# Patient Record
Sex: Female | Born: 1995 | Race: Black or African American | Hispanic: No | Marital: Single | Smoking: Never smoker
Health system: Southern US, Community
[De-identification: ages and names within clinical notes are randomized; demographics above are authoritative.]

## PROBLEM LIST (undated history)

## (undated) DIAGNOSIS — N83209 Unspecified ovarian cyst, unspecified side: Secondary | ICD-10-CM

## (undated) DIAGNOSIS — R519 Headache, unspecified: Secondary | ICD-10-CM

## (undated) DIAGNOSIS — R51 Headache: Secondary | ICD-10-CM

## (undated) HISTORY — DX: Unspecified ovarian cyst, unspecified side: N83.209

## (undated) HISTORY — PX: NO PAST SURGERIES: SHX2092

---
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
## 2016-04-29 ENCOUNTER — Ambulatory Visit (INDEPENDENT_AMBULATORY_CARE_PROVIDER_SITE_OTHER): Payer: Commercial Managed Care - PPO | Admitting: Family Medicine

## 2016-04-29 ENCOUNTER — Encounter: Payer: Self-pay | Admitting: Family Medicine

## 2016-04-29 VITALS — BP 110/77 | Temp 98.8°F | Ht 61.0 in | Wt 191.0 lb

## 2016-04-29 DIAGNOSIS — Z23 Encounter for immunization: Secondary | ICD-10-CM

## 2016-04-29 DIAGNOSIS — Z0000 Encounter for general adult medical examination without abnormal findings: Secondary | ICD-10-CM

## 2016-04-29 LAB — UA/M W/RFLX CULTURE, ROUTINE
Bilirubin, UA: NEGATIVE
GLUCOSE, UA: NEGATIVE
KETONES UA: NEGATIVE
LEUKOCYTES UA: NEGATIVE
NITRITE UA: NEGATIVE
Protein, UA: NEGATIVE
RBC UA: NEGATIVE
SPEC GRAV UA: 1.015 (ref 1.005–1.030)
Urobilinogen, Ur: 0.2 mg/dL (ref 0.2–1.0)
pH, UA: 7 (ref 5.0–7.5)

## 2016-04-29 NOTE — Patient Instructions (Addendum)
Health Maintenance, Female Adopting a healthy lifestyle and getting preventive care can go a long way to promote health and wellness. Talk with your health care provider about what schedule of regular examinations is right for you. This is a good chance for you to check in with your provider about disease prevention and staying healthy. In between checkups, there are plenty of things you can do on your own. Experts have done a lot of research about which lifestyle changes and preventive measures are most likely to keep you healthy. Ask your health care provider for more information. WEIGHT AND DIET  Eat a healthy diet  Be sure to include plenty of vegetables, fruits, low-fat dairy products, and lean protein.  Do not eat a lot of foods high in solid fats, added sugars, or salt.  Get regular exercise. This is one of the most important things you can do for your health.  Most adults should exercise for at least 150 minutes each week. The exercise should increase your heart rate and make you sweat (moderate-intensity exercise).  Most adults should also do strengthening exercises at least twice a week. This is in addition to the moderate-intensity exercise.  Maintain a healthy weight  Body mass index (BMI) is a measurement that can be used to identify possible weight problems. It estimates body fat based on height and weight. Your health care provider can help determine your BMI and help you achieve or maintain a healthy weight.  For females 20 years of age and older:   A BMI below 18.5 is considered underweight.  A BMI of 18.5 to 24.9 is normal.  A BMI of 25 to 29.9 is considered overweight.  A BMI of 30 and above is considered obese.  Watch levels of cholesterol and blood lipids  You should start having your blood tested for lipids and cholesterol at 20 years of age, then have this test every 5 years.  You may need to have your cholesterol levels checked more often if:  Your lipid  or cholesterol levels are high.  You are older than 19 years of age.  You are at high risk for heart disease.  CANCER SCREENING   Lung Cancer  Lung cancer screening is recommended for adults 55-80 years old who are at high risk for lung cancer because of a history of smoking.  A yearly low-dose CT scan of the lungs is recommended for people who:  Currently smoke.  Have quit within the past 15 years.  Have at least a 30-pack-year history of smoking. A pack year is smoking an average of one pack of cigarettes a day for 1 year.  Yearly screening should continue until it has been 15 years since you quit.  Yearly screening should stop if you develop a health problem that would prevent you from having lung cancer treatment.  Breast Cancer  Practice breast self-awareness. This means understanding how your breasts normally appear and feel.  It also means doing regular breast self-exams. Let your health care provider know about any changes, no matter how small.  If you are in your 20s or 30s, you should have a clinical breast exam (CBE) by a health care provider every 1-3 years as part of a regular health exam.  If you are 40 or older, have a CBE every year. Also consider having a breast X-ray (mammogram) every year.  If you have a family history of breast cancer, talk to your health care provider about genetic screening.  If you   are at high risk for breast cancer, talk to your health care provider about having an MRI and a mammogram every year.  Breast cancer gene (BRCA) assessment is recommended for women who have family members with BRCA-related cancers. BRCA-related cancers include:  Breast.  Ovarian.  Tubal.  Peritoneal cancers.  Results of the assessment will determine the need for genetic counseling and BRCA1 and BRCA2 testing. Cervical Cancer Your health care provider may recommend that you be screened regularly for cancer of the pelvic organs (ovaries, uterus, and  vagina). This screening involves a pelvic examination, including checking for microscopic changes to the surface of your cervix (Pap test). You may be encouraged to have this screening done every 3 years, beginning at age 21.  For women ages 30-65, health care providers may recommend pelvic exams and Pap testing every 3 years, or they may recommend the Pap and pelvic exam, combined with testing for human papilloma virus (HPV), every 5 years. Some types of HPV increase your risk of cervical cancer. Testing for HPV may also be done on women of any age with unclear Pap test results.  Other health care providers may not recommend any screening for nonpregnant women who are considered low risk for pelvic cancer and who do not have symptoms. Ask your health care provider if a screening pelvic exam is right for you.  If you have had past treatment for cervical cancer or a condition that could lead to cancer, you need Pap tests and screening for cancer for at least 20 years after your treatment. If Pap tests have been discontinued, your risk factors (such as having a new sexual partner) need to be reassessed to determine if screening should resume. Some women have medical problems that increase the chance of getting cervical cancer. In these cases, your health care provider may recommend more frequent screening and Pap tests. Colorectal Cancer  This type of cancer can be detected and often prevented.  Routine colorectal cancer screening usually begins at 19 years of age and continues through 19 years of age.  Your health care provider may recommend screening at an earlier age if you have risk factors for colon cancer.  Your health care provider may also recommend using home test kits to check for hidden blood in the stool.  A small camera at the end of a tube can be used to examine your colon directly (sigmoidoscopy or colonoscopy). This is done to check for the earliest forms of colorectal  cancer.  Routine screening usually begins at age 50.  Direct examination of the colon should be repeated every 5-10 years through 19 years of age. However, you may need to be screened more often if early forms of precancerous polyps or small growths are found. Skin Cancer  Check your skin from head to toe regularly.  Tell your health care provider about any new moles or changes in moles, especially if there is a change in a mole's shape or color.  Also tell your health care provider if you have a mole that is larger than the size of a pencil eraser.  Always use sunscreen. Apply sunscreen liberally and repeatedly throughout the day.  Protect yourself by wearing long sleeves, pants, a wide-brimmed hat, and sunglasses whenever you are outside. HEART DISEASE, DIABETES, AND HIGH BLOOD PRESSURE   High blood pressure causes heart disease and increases the risk of stroke. High blood pressure is more likely to develop in:  People who have blood pressure in the high end   of the normal range (130-139/85-89 mm Hg).  People who are overweight or obese.  People who are African American.  If you are 38-23 years of age, have your blood pressure checked every 3-5 years. If you are 61 years of age or older, have your blood pressure checked every year. You should have your blood pressure measured twice--once when you are at a hospital or clinic, and once when you are not at a hospital or clinic. Record the average of the two measurements. To check your blood pressure when you are not at a hospital or clinic, you can use:  An automated blood pressure machine at a pharmacy.  A home blood pressure monitor.  If you are between 45 years and 39 years old, ask your health care provider if you should take aspirin to prevent strokes.  Have regular diabetes screenings. This involves taking a blood sample to check your fasting blood sugar level.  If you are at a normal weight and have a low risk for diabetes,  have this test once every three years after 19 years of age.  If you are overweight and have a high risk for diabetes, consider being tested at a younger age or more often. PREVENTING INFECTION  Hepatitis B  If you have a higher risk for hepatitis B, you should be screened for this virus. You are considered at high risk for hepatitis B if:  You were born in a country where hepatitis B is common. Ask your health care provider which countries are considered high risk.  Your parents were born in a high-risk country, and you have not been immunized against hepatitis B (hepatitis B vaccine).  You have HIV or AIDS.  You use needles to inject street drugs.  You live with someone who has hepatitis B.  You have had sex with someone who has hepatitis B.  You get hemodialysis treatment.  You take certain medicines for conditions, including cancer, organ transplantation, and autoimmune conditions. Hepatitis C  Blood testing is recommended for:  Everyone born from [birth year] through 1964.  Anyone with known risk factors for hepatitis C. Sexually transmitted infections (STIs)  You should be screened for sexually transmitted infections (STIs) including gonorrhea and chlamydia if:  You are sexually active and are younger than 19 years of age.  You are older than 19 years of age and your health care provider tells you that you are at risk for this type of infection.  Your sexual activity has changed since you were last screened and you are at an increased risk for chlamydia or gonorrhea. Ask your health care provider if you are at risk.  If you do not have HIV, but are at risk, it may be recommended that you take a prescription medicine daily to prevent HIV infection. This is called pre-exposure prophylaxis (PrEP). You are considered at risk if:  You are sexually active and do not regularly use condoms or know the HIV status of your partner(s).  You take drugs by injection.  You are sexually  active with a partner who has HIV. Talk with your health care provider about whether you are at high risk of being infected with HIV. If you choose to begin PrEP, you should first be tested for HIV. You should then be tested every 3 months for as long as you are taking PrEP.  PREGNANCY   If you are premenopausal and you may become pregnant, ask your health care provider about preconception counseling.  If you may  become pregnant, take 400 to 800 micrograms (mcg) of folic acid every day.  If you want to prevent pregnancy, talk to your health care provider about birth control (contraception). OSTEOPOROSIS AND MENOPAUSE   Osteoporosis is a disease in which the bones lose minerals and strength with aging. This can result in serious bone fractures. Your risk for osteoporosis can be identified using a bone density scan.  If you are 63 years of age or older, or if you are at risk for osteoporosis and fractures, ask your health care provider if you should be screened.  Ask your health care provider whether you should take a calcium or vitamin D supplement to lower your risk for osteoporosis.  Menopause may have certain physical symptoms and risks.  Hormone replacement therapy may reduce some of these symptoms and risks. Talk to your health care provider about whether hormone replacement therapy is right for you.  HOME CARE INSTRUCTIONS   Schedule regular health, dental, and eye exams.  Stay current with your immunizations.   Do not use any tobacco products including cigarettes, chewing tobacco, or electronic cigarettes.  If you are pregnant, do not drink alcohol.  If you are breastfeeding, limit how much and how often you drink alcohol.  Limit alcohol intake to no more than 1 drink per day for nonpregnant women. One drink equals 12 ounces of beer, 5 ounces of wine, or 1 ounces of hard liquor.  Do not use street drugs.  Do not share needles.  Ask your health care provider for help if  you need support or information about quitting drugs.  Tell your health care provider if you often feel depressed.  Tell your health care provider if you have ever been abused or do not feel safe at home.   This information is not intended to replace advice given to you by your health care provider. Make sure you discuss any questions you have with your health care provider.   Document Released: 03/03/2011 Document Revised: 09/08/2014 Document Reviewed: 07/20/2013 Elsevier Interactive Patient Education 2016 [employer] Influenza (Flu) Vaccine (Inactivated or Recombinant):  1. Why get vaccinated? Influenza ("flu") is a contagious disease that spreads around the [place] every year, usually between October and May. Flu is caused by influenza viruses, and is spread mainly by coughing, sneezing, and close contact. Anyone can get flu. Flu strikes suddenly and can last several days. Symptoms vary by age, but can include:  fever/chills  sore throat  muscle aches  fatigue  cough  headache  runny or stuffy nose Flu can also lead to pneumonia and blood infections, and cause diarrhea and seizures in children. If you have a medical condition, such as heart or lung disease, flu can make it worse. Flu is more dangerous for some people. Infants and young children, people 57 years of age and older, pregnant women, and people with certain health conditions or a weakened immune system are at greatest risk. Each year thousands of people in the [place] die from flu, and many more are hospitalized. Flu vaccine can:  keep you from getting flu,  make flu less severe if you do get it, and  keep you from spreading flu to your family and other people. 2. Inactivated and recombinant flu vaccines A dose of flu vaccine is recommended every flu season. Children 6 months through 55 years of age may need two doses during the same flu season. Everyone else needs only one dose each flu  season. Some inactivated flu  vaccines contain a very small amount of a mercury-based preservative called thimerosal. Studies have not shown thimerosal in vaccines to be harmful, but flu vaccines that do not contain thimerosal are available. There is no live flu virus in flu shots. They cannot cause the flu. There are many flu viruses, and they are always changing. Each year a new flu vaccine is made to protect against three or four viruses that are likely to cause disease in the upcoming flu season. But even when the vaccine doesn't exactly match these viruses, it may still provide some protection. Flu vaccine cannot prevent:  flu that is caused by a virus not covered by the vaccine, or  illnesses that look like flu but are not. It takes about 2 weeks for protection to develop after vaccination, and protection lasts through the flu season. 3. Some people should not get this vaccine Tell the person who is giving you the vaccine:  If you have any severe, life-threatening allergies. If you ever had a life-threatening allergic reaction after a dose of flu vaccine, or have a severe allergy to any part of this vaccine, you may be advised not to get vaccinated. Most, but not all, types of flu vaccine contain a small amount of egg protein.  If you ever had Guillain-Barre Syndrome (also called GBS). Some people with a history of GBS should not get this vaccine. This should be discussed with your doctor.  If you are not feeling well. It is usually okay to get flu vaccine when you have a mild illness, but you might be asked to come back when you feel better. 4. Risks of a vaccine reaction With any medicine, including vaccines, there is a chance of reactions. These are usually mild and go away on their own, but serious reactions are also possible. Most people who get a flu shot do not have any problems with it. Minor problems following a flu shot include:  soreness, redness, or swelling where the shot was  given  hoarseness  sore, red or itchy eyes  cough  fever  aches  headache  itching  fatigue If these problems occur, they usually begin soon after the shot and last 1 or 2 days. More serious problems following a flu shot can include the following:  There may be a small increased risk of Guillain-Barre Syndrome (GBS) after inactivated flu vaccine. This risk has been estimated at 1 or 2 additional cases per million people vaccinated. This is much lower than the risk of severe complications from flu, which can be prevented by flu vaccine.  Young children who get the flu shot along with pneumococcal vaccine (PCV13) and/or DTaP vaccine at the same time might be slightly more likely to have a seizure caused by fever. Ask your doctor for more information. Tell your doctor if a child who is getting flu vaccine has ever had a seizure. Problems that could happen after any injected vaccine:  People sometimes faint after a medical procedure, including vaccination. Sitting or lying down for about 15 minutes can help prevent fainting, and injuries caused by a fall. Tell your doctor if you feel dizzy, or have vision changes or ringing in the ears.  Some people get severe pain in the shoulder and have difficulty moving the arm where a shot was given. This happens very rarely.  Any medication can cause a severe allergic reaction. Such reactions from a vaccine are very rare, estimated at about 1 in a million doses, and would happen within  a few minutes to a few hours after the vaccination. As with any medicine, there is a very remote chance of a vaccine causing a serious injury or death. The safety of vaccines is always being monitored. For more information, visit: http://www.aguilar.org/ 5. What if there is a serious reaction? What should I look for?  Look for anything that concerns you, such as signs of a severe allergic reaction, very high fever, or unusual behavior. Signs of a severe  allergic reaction can include hives, swelling of the face and throat, difficulty breathing, a fast heartbeat, dizziness, and weakness. These would start a few minutes to a few hours after the vaccination. What should I do?  If you think it is a severe allergic reaction or other emergency that can't wait, call 9-1-1 and get the person to the nearest hospital. Otherwise, call your doctor.  Reactions should be reported to the Vaccine Adverse Event Reporting System (VAERS). Your doctor should file this report, or you can do it yourself through the VAERS web site at www.vaers.SamedayNews.es, or by calling (416)694-4114. VAERS does not give medical advice. 6. The National Vaccine Injury Compensation Program The [employer] Vaccine Injury Compensation Program (VICP) is a federal program that was created to compensate people who may have been injured by certain vaccines. Persons who believe they may have been injured by a vaccine can learn about the program and about filing a claim by calling (716)694-3407 or visiting the [place] website at GoldCloset.com.ee. There is a time limit to file a claim for compensation. 7. How can I learn more?  Ask your healthcare provider. He or she can give you the vaccine package insert or suggest other sources of information.  Call your local or state health department.  Contact the Centers for Disease Control and Prevention (CDC):  Call 5793384476 (1-800-CDC-INFO) or  Visit CDC's website at https://gibson.com/ Vaccine Information Statement Inactivated Influenza Vaccine (04/07/2014)   This information is not intended to replace advice given to you by your health care provider. Make sure you discuss any questions you have with your health care provider.   Document Released: 06/12/2006 Document Revised: 09/08/2014 Document Reviewed: 04/10/2014 Elsevier Interactive Patient Education [employer].

## 2016-04-29 NOTE — Progress Notes (Signed)
BP 110/77 (BP Location: Left Arm, Patient Position: Sitting, Cuff Size: Large)   Temp 98.8 F (37.1 C)   Ht 5\' 1"  (1.549 m)   Wt 191 lb (86.6 kg)   SpO2 99%   BMI 36.09 kg/m    Subjective:    Patient ID: Beth Wagner, female    DOB: Mar 15, 1996, 20 y.o.   MRN: [id number]  HPI: Beth Wagner is a 20 y.o. female presenting on 04/29/2016 for comprehensive medical examination. Current medical complaints include:none  She currently lives with: Mom and Dad Menopausal Symptoms: no  Depression Screen done today and results listed below:  Depression screen [facility] 2/9 04/29/2016  Decreased Interest 0  Down, Depressed, Hopeless 0  PHQ - 2 Score 0    Past Medical History:  Past Medical History:  Diagnosis Date  . Ovarian cyst     Surgical History:  History reviewed. No pertinent surgical history.  Medications:  No current outpatient prescriptions on file prior to visit.   No current facility-administered medications on file prior to visit.     Allergies:  No Known Allergies  Social History:  Social History   Social History  . Marital status: Single    Spouse name: N/A  . Number of children: N/A  . Years of education: N/A   Occupational History  . Not on file.   Social History Main Topics  . Smoking status: Never Smoker  . Smokeless tobacco: Never Used  . Alcohol use No  . Drug use: No  . Sexual activity: Not on file   Other Topics Concern  . Not on file   Social History Narrative  . No narrative on file   History  Smoking Status  . Never Smoker  Smokeless Tobacco  . Never Used   History  Alcohol Use No    Family History:  Family History  Problem Relation Age of Onset  . Cancer Father     kidney  . Kidney disease Father   . Hypertension Father   . Hypertension Maternal Aunt   . Hypertension Paternal Aunt   . Stroke Maternal Grandmother   . Stroke Maternal Grandfather     Past medical history, surgical history, medications, allergies, family  history and social history reviewed with patient today and changes made to appropriate areas of the chart.   Review of Systems  Constitutional: Negative.   HENT: Negative.   Eyes: Negative.   Respiratory: Negative.   Cardiovascular: Negative.   Gastrointestinal: Negative.   Genitourinary: Negative.   Musculoskeletal: Negative.   Skin: Negative.   Neurological: Negative.   Endo/Heme/Allergies: Negative.     All other ROS negative except what is listed above and in the HPI.      Objective:    BP 110/77 (BP Location: Left Arm, Patient Position: Sitting, Cuff Size: Large)   Temp 98.8 F (37.1 C)   Ht 5\' 1"  (1.549 m)   Wt 191 lb (86.6 kg)   SpO2 99%   BMI 36.09 kg/m   Wt Readings from Last 3 Encounters:  04/29/16 191 lb (86.6 kg) (96 %, Z= 1.81)*  12/01/15 180 lb (81.6 kg) (95 %, Z= 1.64)*  03/29/15 173 lb (78.5 kg) (94 %, Z= 1.54)*   * Growth percentiles are based on CDC 2-20 Years data.    Physical Exam  Constitutional: She is oriented to person, place, and time. She appears well-developed and well-nourished. No distress.  HENT:  Head: Normocephalic and atraumatic.  Right Ear: Hearing, tympanic membrane, external  ear and ear canal normal.  Left Ear: Hearing, tympanic membrane, external ear and ear canal normal.  Nose: Nose normal.  Mouth/Throat: Uvula is midline, oropharynx is clear and moist and mucous membranes are normal. No oropharyngeal exudate.  Eyes: Conjunctivae, EOM and lids are normal. Pupils are equal, round, and reactive to light. Right eye exhibits no discharge. Left eye exhibits no discharge. No scleral icterus.  Neck: Normal range of motion. Neck supple. No JVD present. No tracheal deviation present. No thyromegaly present.  Cardiovascular: Normal rate, regular rhythm, normal heart sounds and intact distal pulses.  Exam reveals no gallop and no friction rub.   No murmur heard. Pulmonary/Chest: Effort normal and breath sounds normal. No stridor. No  respiratory distress. She has no wheezes. She has no rales. She exhibits no tenderness. Right breast exhibits no inverted nipple, no mass, no nipple discharge, no skin change and no tenderness. Left breast exhibits no inverted nipple, no mass, no nipple discharge, no skin change and no tenderness. Breasts are symmetrical.  Abdominal: Soft. Bowel sounds are normal. She exhibits no distension and no mass. There is no tenderness. There is no rebound and no guarding.  Genitourinary:  Genitourinary Comments: Deferred with shared decision making.   Musculoskeletal: Normal range of motion. She exhibits no edema, tenderness or deformity.  Lymphadenopathy:    She has no cervical adenopathy.  Neurological: She is alert and oriented to person, place, and time. She has normal reflexes. She displays normal reflexes. No cranial nerve deficit. She exhibits normal muscle tone. Coordination normal.  Skin: Skin is warm, dry and intact. No rash noted. She is not diaphoretic. No erythema. No pallor.  Psychiatric: She has a normal mood and affect. Her speech is normal and behavior is normal. Judgment and thought content normal. Cognition and memory are normal.  Nursing note and vitals reviewed.   Results for orders placed or performed during the hospital encounter of 12/01/15  Comprehensive metabolic panel  Result Value Ref Range   Sodium 134 (L) 135 - 145 mmol/L   Potassium 3.8 3.5 - 5.1 mmol/L   Chloride 101 101 - 111 mmol/L   CO2 25 22 - 32 mmol/L   Glucose, Bld 88 65 - 99 mg/dL   BUN 13 6 - 20 mg/dL   Creatinine, Ser 1.610.83 0.44 - 1.00 mg/dL   Calcium 9.2 8.9 - 09.610.3 mg/dL   Total Protein 8.1 6.5 - 8.1 g/dL   Albumin 4.4 3.5 - 5.0 g/dL   AST 22 15 - 41 U/L   ALT 25 14 - 54 U/L   Alkaline Phosphatase 77 38 - 126 U/L   Total Bilirubin 1.1 0.3 - 1.2 mg/dL   GFR calc non Af Amer >60 >60 mL/min   GFR calc Af Amer >60 >60 mL/min   Anion gap 8 5 - 15  CBC  Result Value Ref Range   WBC 5.0 3.6 - 11.0 K/uL    RBC 5.30 (H) 3.80 - 5.20 MIL/uL   Hemoglobin 14.2 12.0 - 16.0 g/dL   HCT 04.542.6 40.935.0 - 81.147.0 %   MCV 80.3 80.0 - 100.0 fL   MCH 26.8 26.0 - 34.0 pg   MCHC 33.3 32.0 - 36.0 g/dL   RDW 91.414.2 78.211.5 - 95.614.5 %   Platelets 240 150 - 440 K/uL  Urinalysis complete, with microscopic ([facility] only)  Result Value Ref Range   Color, Urine YELLOW (A) YELLOW   APPearance CLEAR (A) CLEAR   Glucose, UA NEGATIVE NEGATIVE mg/dL  Bilirubin Urine NEGATIVE NEGATIVE   Ketones, ur NEGATIVE NEGATIVE mg/dL   Specific Gravity, Urine 1.012 1.005 - 1.030   Hgb urine dipstick NEGATIVE NEGATIVE   pH 6.0 5.0 - 8.0   Protein, ur NEGATIVE NEGATIVE mg/dL   Nitrite NEGATIVE NEGATIVE   Leukocytes, UA NEGATIVE NEGATIVE   RBC / HPF NONE SEEN 0 - 5 RBC/hpf   WBC, UA 0-5 0 - 5 WBC/hpf   Bacteria, UA NONE SEEN NONE SEEN   Squamous Epithelial / LPF 0-5 (A) NONE SEEN  Lipase, blood  Result Value Ref Range   Lipase 35 11 - 51 U/L  Pregnancy, urine POC  Result Value Ref Range   Preg Test, Ur NEGATIVE NEGATIVE      Assessment & Plan:   Problem List Items Addressed This Visit    None    Visit Diagnoses    Routine general medical examination at a health care facility    -  Primary   Up to date on vaccines. Screening labs checked today. Work on diet and exercise. Continue to monitor. Not sexually active.    Relevant Orders   CBC with Differential/Platelet   Comprehensive metabolic panel   Lipid Panel w/o Chol/HDL Ratio   TSH   UA/M w/rflx Culture, Routine   Immunization due       Flu shot given today.   Relevant Orders   Flu Vaccine QUAD 36+ mos IM (Fluarix & Fluzone Quad PF (Completed)       Follow up plan: Return in about 1 year (around 04/29/2017) for Physical.   LABORATORY TESTING:  - Pap smear: not applicable  IMMUNIZATIONS:   - Tdap: Tetanus vaccination status reviewed: last tetanus booster within 10 years. - Influenza: Administered today - Pneumovax: Not applicable  PATIENT COUNSELING:   Advised to  take 1 mg of folate supplement per day if capable of pregnancy.   Sexuality: Discussed sexually transmitted diseases, partner selection, use of condoms, avoidance of unintended pregnancy  and contraceptive alternatives.   Advised to avoid cigarette smoking.  I discussed with the patient that most people either abstain from alcohol or drink within safe limits (<=14/week and <=4 drinks/occasion for males, <=7/weeks and <= 3 drinks/occasion for females) and that the risk for alcohol disorders and other health effects rises proportionally with the number of drinks per week and how often a drinker exceeds daily limits.  Discussed cessation/primary prevention of drug use and availability of treatment for abuse.   Diet: Encouraged to adjust caloric intake to maintain  or achieve ideal body weight, to reduce intake of dietary saturated fat and total fat, to limit sodium intake by avoiding high sodium foods and not adding table salt, and to maintain adequate dietary potassium and calcium preferably from fresh fruits, vegetables, and low-fat dairy products.    stressed the importance of regular exercise  Injury prevention: Discussed safety belts, safety helmets, smoke detector, smoking near bedding or upholstery.   Dental health: Discussed importance of regular tooth brushing, flossing, and dental visits.    NEXT PREVENTATIVE PHYSICAL DUE IN 1 YEAR. Return in about 1 year (around 04/29/2017) for Physical.

## 2016-04-30 ENCOUNTER — Encounter: Payer: Self-pay | Admitting: Family Medicine

## 2016-04-30 LAB — COMPREHENSIVE METABOLIC PANEL
A/G RATIO: 1.7 (ref 1.2–2.2)
ALBUMIN: 4.2 g/dL (ref 3.5–5.5)
ALK PHOS: 85 IU/L (ref 39–117)
ALT: 17 IU/L (ref 0–32)
AST: 20 IU/L (ref 0–40)
BUN/Creatinine Ratio: 18 (ref 9–23)
BUN: 14 mg/dL (ref 6–20)
Bilirubin Total: 0.6 mg/dL (ref 0.0–1.2)
CO2: 24 mmol/L (ref 18–29)
Calcium: 9 mg/dL (ref 8.7–10.2)
Chloride: 101 mmol/L (ref 96–106)
Creatinine, Ser: 0.78 mg/dL (ref 0.57–1.00)
GFR calc Af Amer: 127 mL/min/{1.73_m2} (ref >59)
GFR, EST NON AFRICAN AMERICAN: 111 mL/min/{1.73_m2} (ref >59)
GLOBULIN, TOTAL: 2.5 g/dL (ref 1.5–4.5)
Glucose: 81 mg/dL (ref 65–99)
Potassium: 4.5 mmol/L (ref 3.5–5.2)
Sodium: 139 mmol/L (ref 134–144)
Total Protein: 6.7 g/dL (ref 6.0–8.5)

## 2016-04-30 LAB — CBC WITH DIFFERENTIAL/PLATELET
BASOS: 0 %
Basophils Absolute: 0 10*3/uL (ref 0.0–0.2)
EOS (ABSOLUTE): 0.2 10*3/uL (ref 0.0–0.4)
EOS: 3 %
HEMATOCRIT: 38.3 % (ref 34.0–46.6)
HEMOGLOBIN: 12.7 g/dL (ref 11.1–15.9)
Immature Grans (Abs): 0 10*3/uL (ref 0.0–0.1)
Immature Granulocytes: 0 %
LYMPHS ABS: 1.5 10*3/uL (ref 0.7–3.1)
Lymphs: 25 %
MCH: 26.6 pg (ref 26.6–33.0)
MCHC: 33.2 g/dL (ref 31.5–35.7)
MCV: 80 fL (ref 79–97)
MONOCYTES: 9 %
MONOS ABS: 0.5 10*3/uL (ref 0.1–0.9)
NEUTROS ABS: 3.9 10*3/uL (ref 1.4–7.0)
Neutrophils: 63 %
Platelets: 239 10*3/uL (ref 150–379)
RBC: 4.77 x10E6/uL (ref 3.77–5.28)
RDW: 14.6 % (ref 12.3–15.4)
WBC: 6.1 10*3/uL (ref 3.4–10.8)

## 2016-04-30 LAB — LIPID PANEL W/O CHOL/HDL RATIO
Cholesterol, Total: 130 mg/dL (ref 100–169)
HDL: 44 mg/dL (ref >39)
LDL CALC: 80 mg/dL (ref 0–109)
TRIGLYCERIDES: 31 mg/dL (ref 0–89)
VLDL CHOLESTEROL CAL: 6 mg/dL (ref 5–40)

## 2016-04-30 LAB — TSH: TSH: 1.34 u[IU]/mL (ref 0.450–4.500)

## 2016-07-03 DIAGNOSIS — R1030 Lower abdominal pain, unspecified: Secondary | ICD-10-CM | POA: Diagnosis present

## 2016-07-03 DIAGNOSIS — R102 Pelvic and perineal pain: Secondary | ICD-10-CM | POA: Insufficient documentation

## 2016-07-03 DIAGNOSIS — R1011 Right upper quadrant pain: Secondary | ICD-10-CM | POA: Insufficient documentation

## 2016-07-03 LAB — BASIC METABOLIC PANEL
Anion gap: 8 (ref 5–15)
BUN: 12 mg/dL (ref 6–20)
CHLORIDE: 106 mmol/L (ref 101–111)
CO2: 24 mmol/L (ref 22–32)
Calcium: 9 mg/dL (ref 8.9–10.3)
Creatinine, Ser: 0.61 mg/dL (ref 0.44–1.00)
GFR calc non Af Amer: >60 mL/min (ref >60)
Glucose, Bld: 112 mg/dL — ABNORMAL HIGH (ref 65–99)
POTASSIUM: 3.8 mmol/L (ref 3.5–5.1)
SODIUM: 138 mmol/L (ref 135–145)

## 2016-07-03 LAB — CBC
HEMATOCRIT: 37.6 % (ref 35.0–47.0)
HEMOGLOBIN: 12.8 g/dL (ref 12.0–16.0)
MCH: 27.1 pg (ref 26.0–34.0)
MCHC: 34 g/dL (ref 32.0–36.0)
MCV: 79.6 fL — ABNORMAL LOW (ref 80.0–100.0)
Platelets: 227 10*3/uL (ref 150–440)
RBC: 4.73 MIL/uL (ref 3.80–5.20)
RDW: 14.2 % (ref 11.5–14.5)
WBC: 7.1 10*3/uL (ref 3.6–11.0)

## 2016-07-03 NOTE — ED Notes (Signed)
Urinalysis not done because [facility] that were done today.

## 2016-07-03 NOTE — ED Triage Notes (Signed)
Pt in with co mid lower abd pain states has had urinary frequency. Went to urgent care and was told urinalysis was negative.

## 2016-07-04 ENCOUNTER — Emergency Department
Admission: EM | Admit: 2016-07-04 | Discharge: 2016-07-04 | Disposition: A | Discharge status: Home / Self Care | Payer: Commercial Managed Care - PPO | Attending: Emergency Medicine | Admitting: Emergency Medicine

## 2016-07-04 ENCOUNTER — Emergency Department: Payer: Commercial Managed Care - PPO

## 2016-07-04 DIAGNOSIS — R102 Pelvic and perineal pain: Secondary | ICD-10-CM

## 2016-07-04 DIAGNOSIS — R1030 Lower abdominal pain, unspecified: Secondary | ICD-10-CM | POA: Diagnosis not present

## 2016-07-04 LAB — URINALYSIS COMPLETE WITH MICROSCOPIC (ARMC ONLY)
Bacteria, UA: NONE SEEN
Bilirubin Urine: NEGATIVE
Glucose, UA: NEGATIVE mg/dL
Hgb urine dipstick: NEGATIVE
KETONES UR: NEGATIVE mg/dL
Leukocytes, UA: NEGATIVE
Nitrite: NEGATIVE
PROTEIN: NEGATIVE mg/dL
Specific Gravity, Urine: 1.02 (ref 1.005–1.030)
pH: 5 (ref 5.0–8.0)

## 2016-07-04 LAB — POCT PREGNANCY, URINE: PREG TEST UR: NEGATIVE

## 2016-07-04 MED ORDER — IBUPROFEN 600 MG PO TABS
600.0000 mg | ORAL_TABLET | Freq: Once | ORAL | Status: AC
  Administered 2016-07-04: 600 mg via ORAL
  Filled 2016-07-04: qty 1

## 2016-07-04 MED ORDER — IBUPROFEN 400 MG PO TABS: 400.0000 mg | ORAL_TABLET | Freq: Four times a day (QID) | ORAL | 0 refills | Status: DC | PRN

## 2016-07-04 NOTE — ED Notes (Signed)
Patient transported to CT 

## 2016-07-04 NOTE — ED Notes (Signed)
Pt c/o lower abdominal pain/pressure, and urinary frequency. Pt reports she is 3 days late on her period, however reports she is a virgin. Pt denies vaginal discharge, pain/burning with urination. Pt denies N/V.

## 2016-07-04 NOTE — ED Notes (Signed)
ED Provider at bedside. 

## 2016-07-04 NOTE — ED Provider Notes (Signed)
Time Seen: Approximately 0111 I have reviewed the triage notes  Chief Complaint: Abdominal Pain   History of Present Illness: Beth Wagner is a 20 y.o. female *who presents with some mid lower abdominal pain and urinary frequency. She states she went to an urgent care earlier today and had a urinalysis which was negative. She denies any risk of being pregnant. She states that her menstrual period is running late. She denies any vaginal discharge and denies currently being sexually active. Pain is primarily across the suprapubic region and started earlier today. She states she's had some similar discomfort returned out to be an ovarian cyst. She denies any fever, nausea, vomiting   Past Medical History:  Diagnosis Date  . Ovarian cyst     Patient Active Problem List   Diagnosis Date Noted  . Allergic rhinitis 03/29/2015    No past surgical history on file.  No past surgical history on file.    Allergies:  Review of patient's allergies indicates no known allergies.  Family History: Family History  Problem Relation Age of Onset  . Cancer Father     kidney  . Kidney disease Father   . Hypertension Father   . Hypertension Maternal Aunt   . Hypertension Paternal Aunt   . Stroke Maternal Grandmother   . Stroke Maternal Grandfather     Social History: Social History  Substance Use Topics  . Smoking status: Never Smoker  . Smokeless tobacco: Never Used  . Alcohol use No     Review of Systems:   10 point review of systems was performed and was otherwise negative:  Constitutional: No fever Eyes: No visual disturbances ENT: No sore throat, ear pain Cardiac: No chest pain Respiratory: No shortness of breath, wheezing, or stridor Abdomen: Abdominal pain is lower middle quadrant without radiation to the back or flank area but on further questioning she states diffuse discomfort and states she does have some mild discomfort over the right upper quadrant. She denies any  constipation or diarrhea. She denies any melena or hematochezia. Endocrine: No weight loss, No night sweats Extremities: No peripheral edema, cyanosis Skin: No rashes, easy bruising Neurologic: No focal weakness, trouble with speech or swollowing Urologic: No dysuria, Hematuria, or urinary frequency   Physical Exam:  ED Triage Vitals [07/03/16 2221]  Enc Vitals Group     BP 136/80     Pulse Rate 90     Resp 18     Temp 98 F (36.7 C)     Temp Source Oral     SpO2 99 %     Weight 190 lb (86.2 kg)     Height 5\' 5"  (1.651 m)     Head Circumference      Peak Flow      Pain Score 8     Pain Loc      Pain Edu?      Excl. in GC?     General: Awake , Alert , and Oriented times 3; GCS 15 Head: Normal cephalic , atraumatic Eyes: Pupils equal , round, reactive to light Nose/Throat: No nasal drainage, patent upper airway without erythema or exudate.  Neck: Supple, Full range of motion, No anterior adenopathy or palpable thyroid masses Lungs: Clear to ascultation without wheezes , rhonchi, or rales Heart: Regular rate, regular rhythm without murmurs , gallops , or rubs Abdomen: Mild tenderness over the lower middle quadrant region without any focal tenderness over McBurney's point, negative Murphy's sign, no palpable masses  Extremities: 2 plus symmetric pulses. No edema, clubbing or cyanosis Neurologic: normal ambulation, Motor symmetric without deficits, sensory intact Skin: warm, dry, no rashes   Labs:   All laboratory work was reviewed including any pertinent negatives or positives listed below:  Labs Reviewed  CBC - Abnormal; Notable for the following:       Result Value   MCV 79.6 (*)    All other components within normal limits  BASIC METABOLIC PANEL - Abnormal; Notable for the following:    Glucose, Bld 112 (*)    All other components within normal limits  URINALYSIS COMPLETEWITH MICROSCOPIC ([facility] ONLY) - Abnormal; Notable for the following:    Color, Urine  YELLOW (*)    APPearance CLEAR (*)    Squamous Epithelial / LPF 0-5 (*)    All other components within normal limits  POC URINE PREG, ED  POCT PREGNANCY, URINE   Laboratory work was reviewed and showed no clinically significant abnormalities.   Radiology: * "Ct Renal Stone Study  Result Date: 07/04/2016 CLINICAL DATA:  Lower abdominal pain and urinary frequency. EXAM: CT ABDOMEN AND PELVIS WITHOUT CONTRAST TECHNIQUE: Multidetector CT imaging of the abdomen and pelvis was performed following the standard protocol without IV contrast. COMPARISON:  12/01/2015 FINDINGS: Lower chest: No acute abnormality. Hepatobiliary: No focal liver abnormality is seen. No gallstones, gallbladder wall thickening, or biliary dilatation. Pancreas: Unremarkable. No pancreatic ductal dilatation or surrounding inflammatory changes. Spleen: Normal in size without focal abnormality. Adrenals/Urinary Tract: Adrenal glands are unremarkable. Kidneys are normal, without renal calculi, focal lesion, or hydronephrosis. Bladder is unremarkable. Stomach/Bowel: Stomach is within normal limits. Appendix appears normal. No evidence of bowel wall thickening, distention, or inflammatory changes. Vascular/Lymphatic: No significant vascular findings are present. No enlarged abdominal or pelvic lymph nodes. Reproductive: Uterus and bilateral adnexa are unremarkable. Other: Small umbilical hernia containing a knuckle of unobstructed small bowel. No acute inflammatory changes in the abdomen or pelvis.  No ascites. Musculoskeletal: No acute or significant osseous findings. IMPRESSION: No acute findings in the abdomen or pelvis. There is a knuckle of unobstructed small bowel protruding into an umbilical hernia. Electronically Signed   By: Ellery Plunkaniel R Mitchell M.D.   On: 07/04/2016 02:38  "  I personally reviewed the radiologic studies    ED Course:  Patient's stay here was uneventful and I cannot discover any reason for her abdominal discomfort  at this time. We had a discussion about endometriosis and some other pathology that is likely cannot be identified per CAT scan evaluation. The patient has been advised to seek emergency care for fever, bloody diarrhea, or any other new concerns and was referred to GYN unassigned. Clinical Course     Assessment: Acute unspecified lower abdominal pain in a female  Final Clinical Impression:   Final diagnoses:  Lower abdominal pain     Plan:  Outpatient " New Prescriptions   IBUPROFEN (ADVIL,MOTRIN) 400 MG TABLET    Take 1 tablet (400 mg total) by mouth every 6 (six) hours as needed.  " Patient was advised to return immediately if condition worsens. Patient was advised to follow up with their primary care physician or other specialized physicians involved in their outpatient care. The patient and/or family member/power of attorney had laboratory results reviewed at the bedside. All questions and concerns were addressed and appropriate discharge instructions were distributed by the nursing staff.             Jennye MoccasinBrian S Shandrell Boda, MD 07/04/16 [phone]

## 2016-07-04 NOTE — ED Notes (Signed)
Pt reports hx of multiple ovarian cysts

## 2016-07-04 NOTE — ED Notes (Signed)
Dr. Quigley at bedside.  

## 2016-07-21 ENCOUNTER — Ambulatory Visit: Payer: Commercial Managed Care - PPO | Admitting: Urology

## 2016-08-27 ENCOUNTER — Encounter: Payer: Self-pay | Admitting: Emergency Medicine

## 2016-08-27 DIAGNOSIS — X503XXA Overexertion from repetitive movements, initial encounter: Secondary | ICD-10-CM | POA: Insufficient documentation

## 2016-08-27 DIAGNOSIS — Y929 Unspecified place or not applicable: Secondary | ICD-10-CM | POA: Diagnosis not present

## 2016-08-27 DIAGNOSIS — Y9389 Activity, other specified: Secondary | ICD-10-CM | POA: Insufficient documentation

## 2016-08-27 DIAGNOSIS — S39012A Strain of muscle, fascia and tendon of lower back, initial encounter: Secondary | ICD-10-CM | POA: Diagnosis not present

## 2016-08-27 DIAGNOSIS — Y998 Other external cause status: Secondary | ICD-10-CM | POA: Insufficient documentation

## 2016-08-27 DIAGNOSIS — S3992XA Unspecified injury of lower back, initial encounter: Secondary | ICD-10-CM | POA: Diagnosis present

## 2016-08-27 LAB — POCT PREGNANCY, URINE: Preg Test, Ur: NEGATIVE

## 2016-08-27 NOTE — ED Triage Notes (Signed)
Pt ambulatory to triage with steady gait with c/o coccyx pain accompanied by headache since Sunday. Pt denies known injury. Pt alert and oriented x 4, skin warm and dry.

## 2016-08-28 ENCOUNTER — Emergency Department
Admission: EM | Admit: 2016-08-28 | Discharge: 2016-08-28 | Disposition: A | Discharge status: Home / Self Care | Payer: Commercial Managed Care - PPO | Attending: Emergency Medicine | Admitting: Emergency Medicine

## 2016-08-28 DIAGNOSIS — S3992XA Unspecified injury of lower back, initial encounter: Secondary | ICD-10-CM

## 2016-08-28 DIAGNOSIS — S39012A Strain of muscle, fascia and tendon of lower back, initial encounter: Secondary | ICD-10-CM | POA: Diagnosis not present

## 2016-08-28 MED ORDER — KETOROLAC TROMETHAMINE 30 MG/ML IJ SOLN
60.0000 mg | Freq: Once | INTRAMUSCULAR | Status: DC
  Filled 2016-08-28: qty 2

## 2016-08-28 MED ORDER — NAPROXEN 500 MG PO TABS: 500.0000 mg | ORAL_TABLET | Freq: Two times a day (BID) | ORAL | 0 refills | Status: DC

## 2016-08-28 MED ORDER — CYCLOBENZAPRINE HCL 5 MG PO TABS: ORAL_TABLET | ORAL | 0 refills | Status: DC

## 2016-08-28 MED ORDER — CYCLOBENZAPRINE HCL 10 MG PO TABS
5.0000 mg | ORAL_TABLET | Freq: Once | ORAL | Status: AC
  Administered 2016-08-28: 5 mg via ORAL
  Filled 2016-08-28: qty 1

## 2016-08-28 NOTE — ED Notes (Signed)
Pt discharged to home.  Family member driving.  Discharge instructions reviewed.  Verbalized understanding.  No questions or concerns at this time.  Teach back verified.  Pt in NAD.  No items left in ED.   

## 2016-08-28 NOTE — Discharge Instructions (Signed)
1.  You may take medicines as needed for pain and muscle spasms (Naprosyn/Flexeril). 2.  Apply moist heat to affected area several times daily. 3.  Return to the ER for worsening symptoms, persistent vomiting, difficulty breathing or other concerns. 

## 2016-08-28 NOTE — ED Provider Notes (Signed)
[facility] Emergency Department Provider Note   ____________________________________________   First MD Initiated Contact with Patient 08/28/16 [phone]     (approximate)  I have reviewed the triage vital signs and the nursing notes.   HISTORY  Chief Complaint Tailbone Pain    HPI Beth Wagner is a 20 y.o. female who presents to the ED from home with a chief complaint of low back and coccyx pain. Patient denies fall. Reports she "overdid it" at the gym 4 days ago with the step machine and exercises laying on her back on a thin mat. Complains of progressive soreness to her lower back and coccyx. Denies extremity weakness, numbness/tingling, bowel or bladder incontinence. Reports tension-type frontal headache which she thinks is related to tension in her shoulders as a result of working out. Denies associated fever, chills, chest pain, shortness of breath, abdominal pain, nausea, vomiting, diarrhea.Denies tea colored urine. Denies recent travel. Nothing makes her symptoms better. Movement makes her symptoms worse.   Past Medical History:  Diagnosis Date  . Ovarian cyst     Patient Active Problem List   Diagnosis Date Noted  . Allergic rhinitis 03/29/2015    History reviewed. No pertinent surgical history.  Prior to Admission medications   Medication Sig Start Date End Date Taking? Authorizing Provider  cyclobenzaprine (FLEXERIL) 5 MG tablet 1 tablet every 8 hours as he did for muscle spasms 08/28/16   Irean HongJade J Shenicka Sunderlin, MD  ibuprofen (ADVIL,MOTRIN) 400 MG tablet Take 1 tablet (400 mg total) by mouth every 6 (six) hours as needed. 07/04/16   Jennye MoccasinBrian S Quigley, MD  naproxen (NAPROSYN) 500 MG tablet Take 1 tablet (500 mg total) by mouth 2 (two) times daily with a meal. 08/28/16   Irean HongJade J Corina Stacy, MD    Allergies Patient has no known allergies.  Family History  Problem Relation Age of Onset  . Cancer Father     kidney  . Kidney disease Father   . Hypertension  Father   . Hypertension Maternal Aunt   . Hypertension Paternal Aunt   . Stroke Maternal Grandmother   . Stroke Maternal Grandfather     Social History Social History  Substance Use Topics  . Smoking status: Never Smoker  . Smokeless tobacco: Never Used  . Alcohol use No    Review of Systems  Constitutional: No fever/chills. Eyes: No visual changes. ENT: No sore throat. Cardiovascular: Denies chest pain. Respiratory: Denies shortness of breath. Gastrointestinal: No abdominal pain.  No nausea, no vomiting.  No diarrhea.  No constipation. Genitourinary: Negative for dysuria. Musculoskeletal: Positive for back pain. Skin: Negative for rash. Neurological: Positive for headache. Negative for focal weakness or numbness.  10-point ROS otherwise negative.  ____________________________________________   PHYSICAL EXAM:  VITAL SIGNS: ED Triage Vitals  Enc Vitals Group     BP 08/27/16 2337 115/75     Pulse Rate 08/27/16 2337 (!) 109     Resp 08/27/16 2337 18     Temp 08/27/16 2337 99.3 F (37.4 C)     Temp Source 08/27/16 2337 Oral     SpO2 08/27/16 2337 99 %     Weight 08/27/16 2337 190 lb (86.2 kg)     Height 08/27/16 2337 5' (1.524 m)     Head Circumference --      Peak Flow --      Pain Score 08/27/16 2338 8     Pain Loc --      Pain Edu? --  Excl. in GC? --     Constitutional: Alert and oriented. Well appearing and in no acute distress. Eyes: Conjunctivae are normal. PERRL. EOMI. Head: Atraumatic. Nose: No congestion/rhinnorhea. Mouth/Throat: Mucous membranes are moist.  Oropharynx non-erythematous. Neck: No stridor.  Supple neck without meningismus. Cardiovascular: Normal rate, regular rhythm. Grossly normal heart sounds.  Good peripheral circulation. Respiratory: Normal respiratory effort.  No retractions. Lungs CTAB. Gastrointestinal: Soft and nontender. No distention. No abdominal bruits. No CVA tenderness. Musculoskeletal: No midline tenderness to  palpation. Paraspinal lumbar muscle spasms and coccyx tenderness to palpation. No deformities noted. No lower extremity tenderness nor edema.  No joint effusions. Neurologic:  Normal speech and language. No gross focal neurologic deficits are appreciated. No gait instability. Skin:  Skin is warm, dry and intact. No rash noted. Psychiatric: Mood and affect are normal. Speech and behavior are normal.  ____________________________________________   LABS (all labs ordered are listed, but only abnormal results are displayed)  Labs Reviewed  POCT PREGNANCY, URINE   ____________________________________________  EKG  None ____________________________________________  RADIOLOGY  None ____________________________________________   PROCEDURES  Procedure(s) performed: None  Procedures  Critical Care performed: No  ____________________________________________   INITIAL IMPRESSION / ASSESSMENT AND PLAN / ED COURSE  Pertinent labs & imaging results that were available during my care of the patient were reviewed by me and considered in my medical decision making (see chart for details).  20 year old female who presents with lumbosacral strain after overexertion. No suspicion for rhabdomyolysis. Will treat with NSAID, muscle relaxer and she will follow-up with her PCP next week. Strict return precautions given. Patient verbalizes understanding and agrees with plan of care.  Clinical Course      ____________________________________________   FINAL CLINICAL IMPRESSION(S) / ED DIAGNOSES  Final diagnoses:  Lumbosacral injury, initial encounter      NEW MEDICATIONS STARTED DURING THIS VISIT:  New Prescriptions   CYCLOBENZAPRINE (FLEXERIL) 5 MG TABLET    1 tablet every 8 hours as he did for muscle spasms   NAPROXEN (NAPROSYN) 500 MG TABLET    Take 1 tablet (500 mg total) by mouth 2 (two) times daily with a meal.     Note:  This document was prepared using Dragon voice  recognition software and may include unintentional dictation errors.    Irean HongJade J Samreen Seltzer, MD 08/28/16 [phone]

## 2016-08-30 ENCOUNTER — Emergency Department: Payer: Commercial Managed Care - PPO

## 2016-08-30 ENCOUNTER — Encounter: Payer: Self-pay | Admitting: *Deleted

## 2016-08-30 ENCOUNTER — Emergency Department
Admission: EM | Admit: 2016-08-30 | Discharge: 2016-08-30 | Disposition: A | Discharge status: Home / Self Care | Payer: Commercial Managed Care - PPO | Attending: Emergency Medicine | Admitting: Emergency Medicine

## 2016-08-30 DIAGNOSIS — M533 Sacrococcygeal disorders, not elsewhere classified: Secondary | ICD-10-CM | POA: Insufficient documentation

## 2016-08-30 MED ORDER — MELOXICAM 7.5 MG PO TABS: 7.5000 mg | ORAL_TABLET | Freq: Every day | ORAL | 1 refills | Status: DC

## 2016-08-30 NOTE — ED Notes (Signed)
Tailbone pain, no known injury. Pt alert and oriented X4, active, cooperative, pt in NAD. RR even and unlabored, color WNL.  See triage note.

## 2016-08-30 NOTE — ED Provider Notes (Signed)
[facility] Emergency Department Provider Note  ____________________________________________  Time seen: Approximately 6:43 PM  I have reviewed the triage vital signs and the nursing notes.   HISTORY  Chief Complaint Tailbone Pain    HPI Beth Wagner is a 20 y.o. female presenting with coccyx pain. Patient was seen on 08/28/2016 for the same symptoms. Patient states that she feels like pain started after she "overdid it" at the gym. Patient states that she was doing several exercises on an exercise mat when pain started. Patient currently rates her pain as 8 out of 10 and describes it as aching. Pain occasionally radiates into patient's buttocks bilaterally. Patient's pain is relieved with ibuprofen. She states that muscle relaxers previously prescribed to her are too sedating. Patient denies dysuria, increased urinary frequency, flank pain or bowel or bladder incontinence. She has been afebrile.   Past Medical History:  Diagnosis Date  . Ovarian cyst     Patient Active Problem List   Diagnosis Date Noted  . Allergic rhinitis 03/29/2015    History reviewed. No pertinent surgical history.  Prior to Admission medications   Medication Sig Start Date End Date Taking? Authorizing Provider  cyclobenzaprine (FLEXERIL) 5 MG tablet 1 tablet every 8 hours as he did for muscle spasms 08/28/16   Irean HongJade J Sung, MD  ibuprofen (ADVIL,MOTRIN) 400 MG tablet Take 1 tablet (400 mg total) by mouth every 6 (six) hours as needed. 07/04/16   Jennye MoccasinBrian S Quigley, MD  meloxicam (MOBIC) 7.5 MG tablet Take 1 tablet (7.5 mg total) by mouth daily. 08/30/16 08/30/17  Orvil FeilJaclyn M Lowana Hable, PA-C  naproxen (NAPROSYN) 500 MG tablet Take 1 tablet (500 mg total) by mouth 2 (two) times daily with a meal. 08/28/16   Irean HongJade J Sung, MD    Allergies Patient has no known allergies.  Family History  Problem Relation Age of Onset  . Cancer Father     kidney  . Kidney disease Father   . Hypertension  Father   . Hypertension Maternal Aunt   . Hypertension Paternal Aunt   . Stroke Maternal Grandmother   . Stroke Maternal Grandfather     Social History Social History  Substance Use Topics  . Smoking status: Never Smoker  . Smokeless tobacco: Never Used  . Alcohol use No     Review of Systems  Constitutional: No fever/chills ENT: No upper respiratory complaints. Cardiovascular: no chest pain. Respiratory: no cough. No SOB. Gastrointestinal: No abdominal pain.  No nausea, no vomiting.  No diarrhea.  No constipation. Genitourinary: Negative for dysuria. No hematuria Musculoskeletal: Patient has coccyx pain Skin: No bruising, edema or erythema of the skin overlying the low back. Neurological: Negative for headaches, focal weakness or numbness. 10-point ROS otherwise negative.  ____________________________________________   PHYSICAL EXAM:  VITAL SIGNS: ED Triage Vitals  Enc Vitals Group     BP 08/30/16 1518 118/68     Pulse Rate 08/30/16 1518 100     Resp 08/30/16 1518 18     Temp 08/30/16 1518 98.9 F (37.2 C)     Temp Source 08/30/16 1518 Oral     SpO2 08/30/16 1518 97 %     Weight 08/30/16 1513 190 lb (86.2 kg)     Height 08/30/16 1513 5' (1.524 m)     Head Circumference --      Peak Flow --      Pain Score 08/30/16 1513 8     Pain Loc --      Pain Edu? --  Excl. in GC? --      Constitutional: Alert and oriented. Well appearing and in no acute distress. Neck: No cervical spine tenderness to palpation. Cardiovascular: Normal rate, regular rhythm. Normal S1 and S2.  Good peripheral circulation.No murmurs, gallops or rubs auscultated. Respiratory: Normal respiratory effort without tachypnea or retractions. Lungs CTAB. Good air entry to the bases with no decreased or absent breath sounds. Gastrointestinal: Bowel sounds 4 quadrants. Soft and nontender to palpation. No suprapubic tenderness. No guarding or rigidity. No palpable masses. No distention. No CVA  tenderness. Musculoskeletal:Patient has 5/5 strength in the upper and lower extremities bilaterally. Full range of motion at the shoulder, elbow and wrist bilaterally. Full range of motion at the hip, knee and ankle bilaterally. No changes in gait.  Patient's coccyx pain is intensified with flexion at the spine. No pain is elicited with extension at the spine. No tenderness with palpation of the lumbar spine. Patient states that with flexion at the spine, pain occasionally radiates into her buttocks bilaterally. Neurologic:  Normal speech and language. No gross focal neurologic deficits are appreciated. Reflexes are 2+ and symmetric in the upper and lower extremities bilaterally. Skin:  Skin is warm, dry and intact. No bruising or edema of the skin overlying the low back. Psychiatric: Mood and affect are normal. Speech and behavior are normal. Patient exhibits appropriate insight and judgement.   ____________________________________________   LABS (all labs ordered are listed, but only abnormal results are displayed)  Labs Reviewed - No data to display ____________________________________________  EKG   ____________________________________________  RADIOLOGY  Orvil FeilJaclyn M Memori Sammon, personally viewed and evaluated these images (plain radiographs) as part of my medical decision making, as well as reviewing the written report by the radiologist.  Dg Lumbar Spine Complete  Result Date: 08/30/2016 CLINICAL DATA:  Lower back pain for several days without known injury. EXAM: LUMBAR SPINE - COMPLETE 4+ VIEW COMPARISON:  None. FINDINGS: There is no evidence of lumbar spine fracture. Alignment is normal. Intervertebral disc spaces are maintained. IMPRESSION: Normal lumbar spine. Electronically Signed   By: Lupita RaiderJames  Green Jr, M.D.   On: 08/30/2016 18:26    ____________________________________________    PROCEDURES  Procedure(s) performed:    Procedures    Medications - No data to  display   ____________________________________________   INITIAL IMPRESSION / ASSESSMENT AND PLAN / ED COURSE  Pertinent labs & imaging results that were available during my care of the patient were reviewed by me and considered in my medical decision making (see chart for details).  Review of the [place] CSRS was performed in accordance of the [facility] prior to dispensing any controlled drugs.  Clinical Course    Assessment and Plan: Coccyx Pain: Patient presents with 8 out of 10, coccyx pain since 08/24/2016. Patient associates onset of coccyx pain with an intense workout session at the gym. On physical exam, coccyx pain was intensified with flexion at the spine. Patient had some radiation of pain into the buttocks bilaterally. Patient has some relief of her symptoms with ibuprofen use. I discussed with patient the role of a systemic steroid in reducing her pain. Patient stated that she does not wish to be started on an oral steroid at this time. Patient was discharged with Mobic. A referral was made to orthopedics, Dr. Martha ClanKrasinski. Vital signs are reassuring at this time. All patient questions were answered.     ____________________________________________  FINAL CLINICAL IMPRESSION(S) / ED DIAGNOSES  Final diagnoses:  Coccyx pain      NEW  MEDICATIONS STARTED DURING THIS VISIT:  New Prescriptions   MELOXICAM (MOBIC) 7.5 MG TABLET    Take 1 tablet (7.5 mg total) by mouth daily.        This chart was dictated using voice recognition software/Dragon. Despite best efforts to proofread, errors can occur which can change the meaning. Any change was purely unintentional.    Orvil Feil, PA-C 08/30/16 1858    Governor Rooks, MD 08/31/16 [phone]

## 2016-08-30 NOTE — ED Notes (Signed)
Pt alert and oriented X4, active, cooperative, pt in NAD. RR even and unlabored, color WNL.  Pt informed to return if any life threatening symptoms occur.   

## 2016-08-30 NOTE — ED Triage Notes (Signed)
States she was seen in Ed for tailbone pain and states continued pain, states pain meds made her feel bad, ambulatory to triage

## 2016-08-31 ENCOUNTER — Encounter: Payer: Self-pay | Admitting: Family Medicine

## 2016-08-31 ENCOUNTER — Emergency Department
Admission: EM | Admit: 2016-08-31 | Discharge: 2016-08-31 | Disposition: A | Discharge status: Home / Self Care | Payer: Commercial Managed Care - PPO | Attending: Emergency Medicine | Admitting: Emergency Medicine

## 2016-08-31 ENCOUNTER — Encounter: Payer: Self-pay | Admitting: Emergency Medicine

## 2016-08-31 DIAGNOSIS — A6004 Herpesviral vulvovaginitis: Secondary | ICD-10-CM | POA: Insufficient documentation

## 2016-08-31 DIAGNOSIS — Z79899 Other long term (current) drug therapy: Secondary | ICD-10-CM | POA: Insufficient documentation

## 2016-08-31 DIAGNOSIS — N764 Abscess of vulva: Secondary | ICD-10-CM | POA: Diagnosis present

## 2016-08-31 MED ORDER — VALACYCLOVIR HCL 1 G PO TABS: 1000.0000 mg | ORAL_TABLET | Freq: Two times a day (BID) | ORAL | 0 refills | Status: DC

## 2016-08-31 MED ORDER — KETOROLAC TROMETHAMINE 30 MG/ML IJ SOLN
30.0000 mg | Freq: Once | INTRAMUSCULAR | Status: AC
  Administered 2016-08-31: 30 mg via INTRAVENOUS
  Filled 2016-08-31: qty 1

## 2016-08-31 MED ORDER — TRAMADOL HCL 50 MG PO TABS: 50.0000 mg | ORAL_TABLET | Freq: Four times a day (QID) | ORAL | 0 refills | Status: DC | PRN

## 2016-08-31 MED ORDER — SODIUM CHLORIDE 0.9 % IV BOLUS (SEPSIS)
1000.0000 mL | Freq: Once | INTRAVENOUS | Status: AC
  Administered 2016-08-31: 1000 mL via INTRAVENOUS

## 2016-08-31 MED ORDER — VALACYCLOVIR HCL 500 MG PO TABS
1000.0000 mg | ORAL_TABLET | Freq: Once | ORAL | Status: AC
  Administered 2016-08-31: 1000 mg via ORAL
  Filled 2016-08-31: qty 2

## 2016-08-31 NOTE — ED Triage Notes (Signed)
Pt c/o abscess to ingrown hair in suprapubic area "for years"; some drainage from site after squeezing the area; pt says also with abscess to left labia that started Friday when her menstrual cycle started; pt in no acute distress; denies fever

## 2016-08-31 NOTE — ED Provider Notes (Signed)
[facility] Emergency Department Provider Note   ____________________________________________   First MD Initiated Contact with Patient 08/31/16 [phone]     (approximate)  I have reviewed the triage vital signs and the nursing notes.   HISTORY  Chief Complaint Abscess    HPI Beth Wagner is a 20 y.o. female who comes into the hospital today with a boil on her vagina. She reports that she's had it for years but now she has 1 on her labia. She reports that it started on Friday. She reports that she started her menstrual cycle and it was hurting. The patient denies any fevers, nausea, vomiting. The patient had been seen here for back pain and was on Flexeril but reports that it was making her heart race. The patient reports she is under a lot of stress. She wasn't sure what was causing this boil so she decided to come into the hospital to get checked out.   Past Medical History:  Diagnosis Date  . Ovarian cyst     Patient Active Problem List   Diagnosis Date Noted  . Allergic rhinitis 03/29/2015    History reviewed. No pertinent surgical history.  Prior to Admission medications   Medication Sig Start Date End Date Taking? Authorizing Provider  cyclobenzaprine (FLEXERIL) 5 MG tablet 1 tablet every 8 hours as he did for muscle spasms 08/28/16  Yes Irean HongJade J Sung, MD  meloxicam (MOBIC) 7.5 MG tablet Take 1 tablet (7.5 mg total) by mouth daily. 08/30/16 08/30/17 Yes Orvil FeilJaclyn M Woods, PA-C  naproxen (NAPROSYN) 500 MG tablet Take 1 tablet (500 mg total) by mouth 2 (two) times daily with a meal. 08/28/16  Yes Irean HongJade J Sung, MD  traMADol (ULTRAM) 50 MG tablet Take 1 tablet (50 mg total) by mouth every 6 (six) hours as needed. 08/31/16   Rebecka ApleyAllison P Nkechi Linehan, MD  valACYclovir (VALTREX) 1000 MG tablet Take 1 tablet (1,000 mg total) by mouth 2 (two) times daily. 08/31/16   Rebecka ApleyAllison P Synethia Endicott, MD    Allergies Patient has no known allergies.  Family History  Problem  Relation Age of Onset  . Cancer Father     kidney  . Kidney disease Father   . Hypertension Father   . Hypertension Maternal Aunt   . Hypertension Paternal Aunt   . Stroke Maternal Grandmother   . Stroke Maternal Grandfather     Social History Social History  Substance Use Topics  . Smoking status: Never Smoker  . Smokeless tobacco: Never Used  . Alcohol use No    Review of Systems Constitutional: No fever/chills Eyes: No visual changes. ENT: No sore throat. Cardiovascular: Denies chest pain. Respiratory: Denies shortness of breath. Gastrointestinal: No abdominal pain.  No nausea, no vomiting.  No diarrhea.  No constipation. Genitourinary: Vaginal pain Musculoskeletal: Negative for back pain. Skin: Negative for rash. Neurological: Negative for headaches, focal weakness or numbness.  10-point ROS otherwise negative.  ____________________________________________   PHYSICAL EXAM:  VITAL SIGNS: ED Triage Vitals  Enc Vitals Group     BP 08/31/16 0241 116/65     Pulse Rate 08/31/16 0241 (!) 112     Resp 08/31/16 0241 (!) 24     Temp 08/31/16 0241 100 F (37.8 C)     Temp Source 08/31/16 0241 Oral     SpO2 08/31/16 0241 99 %     Weight 08/31/16 0237 190 lb (86.2 kg)     Height 08/31/16 0237 5' (1.524 m)     Head Circumference --  Peak Flow --      Pain Score 08/31/16 0237 10     Pain Loc --      Pain Edu? --      Excl. in GC? --     Constitutional: Alert and oriented. Well appearing and in Mild distress. Eyes: Conjunctivae are normal. PERRL. EOMI. Head: Atraumatic. Nose: No congestion/rhinnorhea. Mouth/Throat: Mucous membranes are moist.  Oropharynx non-erythematous. Cardiovascular: Tachycardia, regular rhythm. Grossly normal heart sounds.  Good peripheral circulation. Respiratory: Normal respiratory effort.  No retractions. Lungs CTAB. Gastrointestinal: Soft and nontender. No distention. Genitourinary: Ulcers noted at the vaginal introitus. Some mild  tissue induration with no abscess noted on the left labia. Musculoskeletal: No lower extremity tenderness nor edema.   Neurologic:  Normal speech and language. No gross focal neurologic deficits are appreciated. No gait instability. Skin:  Skin is warm, dry and intact. No rash noted. Psychiatric: Mood and affect are normal. Speech and behavior are normal.  ____________________________________________   LABS (all labs ordered are listed, but only abnormal results are displayed)  Labs Reviewed  HERPES SIMPLEX VIRUS(HSV) DNA BY PCR   ____________________________________________  EKG  none ____________________________________________  RADIOLOGY  none ____________________________________________   PROCEDURES  Procedure(s) performed: None  Procedures  Critical Care performed: No  ____________________________________________   INITIAL IMPRESSION / ASSESSMENT AND PLAN / ED COURSE  Pertinent labs & imaging results that were available during my care of the patient were reviewed by me and considered in my medical decision making (see chart for details).  This is a 20 year old female who comes into the hospital today with vaginal pain. The patient reports that she has a boil on her labia. When I evaluated the patient appears that the patient has some ulcers on her labia. I am concerned that the patient has herpes vulvovaginitis. I will give the patient a dose of valacyclovir and toradol. I also gave the patient some NS for tachycardia. I sent it may just be PCR as well to determine if the patient had HSV.  After the medication the patient's heart rate improved. She'll be discharged to home to follow-up with her primary care physician.  Clinical Course      ____________________________________________   FINAL CLINICAL IMPRESSION(S) / ED DIAGNOSES  Final diagnoses:  Herpes simplex vulvovaginitis      NEW MEDICATIONS STARTED DURING THIS VISIT:  New Prescriptions    TRAMADOL (ULTRAM) 50 MG TABLET    Take 1 tablet (50 mg total) by mouth every 6 (six) hours as needed.   VALACYCLOVIR (VALTREX) 1000 MG TABLET    Take 1 tablet (1,000 mg total) by mouth 2 (two) times daily.     Note:  This document was prepared using Dragon voice recognition software and may include unintentional dictation errors.    Rebecka ApleyAllison P Damarkus Balis, MD 08/31/16 [phone]

## 2016-09-02 ENCOUNTER — Telehealth: Payer: Self-pay | Admitting: Emergency Medicine

## 2016-09-02 LAB — HERPES SIMPLEX VIRUS(HSV) DNA BY PCR
HSV 1 DNA: NEGATIVE
HSV 2 DNA: NEGATIVE

## 2016-09-02 NOTE — Telephone Encounter (Addendum)
Patient was seen at the ED. Unable to reply to patients message.

## 2016-09-02 NOTE — Telephone Encounter (Signed)
Patient's dad called saying the medicine is not working.  I explained that I had to speak to the patient.  Patient is tearful.  She says she is having pain and she is having loss of appetite and a headache.  I advised her to be rechecked to day at her pcp. She agrees to call them, and I told her I would call as well.   I called [facility] and spoke with staphanie.  She says patient did call to and has appt Thursday.  I told her I had advised patient to be seen today.  She says patient refused the appointment she offered for today because her pcp is not there today.

## 2016-09-04 ENCOUNTER — Telehealth: Payer: Self-pay | Admitting: Family Medicine

## 2016-09-04 ENCOUNTER — Encounter: Payer: Self-pay | Admitting: Family Medicine

## 2016-09-04 ENCOUNTER — Ambulatory Visit (INDEPENDENT_AMBULATORY_CARE_PROVIDER_SITE_OTHER): Payer: Commercial Managed Care - PPO | Admitting: Family Medicine

## 2016-09-04 ENCOUNTER — Ambulatory Visit: Payer: Commercial Managed Care - PPO | Admitting: Family Medicine

## 2016-09-04 VITALS — BP 125/84 | HR 73 | Temp 97.8°F | Wt 191.7 lb

## 2016-09-04 DIAGNOSIS — N766 Ulceration of vulva: Secondary | ICD-10-CM

## 2016-09-04 DIAGNOSIS — Z113 Encounter for screening for infections with a predominantly sexual mode of transmission: Secondary | ICD-10-CM | POA: Diagnosis not present

## 2016-09-04 MED ORDER — AZITHROMYCIN 500 MG PO TABS: 500.0000 mg | ORAL_TABLET | Freq: Every day | ORAL | 0 refills | Status: DC

## 2016-09-04 MED ORDER — METRONIDAZOLE 500 MG PO TABS: 500.0000 mg | ORAL_TABLET | Freq: Two times a day (BID) | ORAL | 0 refills | Status: DC

## 2016-09-04 MED ORDER — LIDOCAINE HCL 4 % EX GEL: 1.0000 "application " | Freq: Four times a day (QID) | CUTANEOUS | 3 refills | Status: DC | PRN

## 2016-09-04 MED ORDER — ACYCLOVIR 400 MG PO TABS: 400.0000 mg | ORAL_TABLET | Freq: Three times a day (TID) | ORAL | 0 refills | Status: DC

## 2016-09-04 NOTE — Progress Notes (Signed)
BP 125/84 (BP Location: Left Arm, Patient Position: Sitting, Cuff Size: Large)   Pulse 73   Temp 97.8 F (36.6 C)   Wt 191 lb 11.2 oz (87 kg)   LMP 08/30/2016 (Exact Date)   SpO2 99%   BMI 37.44 kg/m    Subjective:    Patient ID: Beth Wagner, female    DOB: 1995-10-26, 21 y.o.   MRN: [id number]  HPI: Beth Wagner is a 21 y.o. female  Chief Complaint  Patient presents with  . 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   RASH Duration:  Started with pain on Friday  Location: groin  Itching: no Burning: yes Redness: yes Oozing: no Scaling: yes Blisters: yes Painful: yes Fevers: no Change in detergents/soaps/personal care products: used summers eve recently Recent illness: no Recent travel:no History of same: no Context: worse Alleviating factors: nothing Treatments attempted:valetrex Shortness of breath: no  Throat/tongue swelling: no Myalgias/arthralgias: no   STD SCREENING Sexual activity:  Recent unprotected sexual encounter Contraception: no Recent unprotected intercourse: yes History of sexually transmitted diseases: no Previous sexually transmitted disease screening: no Genital lesions: yes Vaginal discharge: yes Dysuria: yes Swollen lymph nodes: yes Fevers: no Rash: yes    Relevant past medical, surgical, family and social history reviewed and updated as indicated. Interim medical history since our last visit reviewed. Allergies and medications reviewed and updated.  Review of Systems  Constitutional: Negative.   Respiratory: Negative.   Cardiovascular: Negative.   Gastrointestinal: Negative.   Genitourinary: Positive for dyspareunia, dysuria, genital sores, pelvic pain, vaginal discharge and vaginal pain. Negative for decreased urine volume, difficulty urinating, enuresis, flank pain, frequency,  hematuria, menstrual problem, urgency and vaginal bleeding.  Psychiatric/Behavioral: Negative.     Per HPI unless specifically indicated above     Objective:    BP 125/84 (BP Location: Left Arm, Patient Position: Sitting, Cuff Size: Large)   Pulse 73   Temp 97.8 F (36.6 C)   Wt 191 lb 11.2 oz (87 kg)   LMP 08/30/2016 (Exact Date)   SpO2 99%   BMI 37.44 kg/m   Wt Readings from Last 3 Encounters:  09/04/16 191 lb 11.2 oz (87 kg) (97 %, Z= 1.81)*  08/31/16 190 lb (86.2 kg) (96 %, Z= 1.78)*  08/30/16 190 lb (86.2 kg) (96 %, Z= 1.78)*   * Growth percentiles are based on CDC 2-20 Years data.    Physical Exam  Constitutional: She is oriented to person, place, and time. She appears well-developed and well-nourished. She appears distressed.  HENT:  Head: Normocephalic and atraumatic.  Right Ear: Hearing normal.  Left Ear: Hearing normal.  Nose: Nose normal.  Eyes: Conjunctivae and lids are normal. Right eye exhibits no discharge. Left eye exhibits no discharge. No scleral icterus.  Pulmonary/Chest: Effort normal. No respiratory distress.  Abdominal: Hernia confirmed negative in the right inguinal area and confirmed negative in the left inguinal area.  Genitourinary:    There is tenderness and lesion on the left labia. There is tenderness in the vagina. Vaginal discharge found.  Musculoskeletal: Normal range of motion.  Neurological: She is alert and oriented to person, place, and time.  Skin: Skin is warm, dry and intact. No rash noted. She is not diaphoretic. No erythema. No pallor.  Psychiatric: She has a normal mood and affect.  Her speech is normal and behavior is normal. Judgment and thought content normal. Cognition and memory are normal.  Nursing note and vitals reviewed.   Results for orders placed or performed during the hospital encounter of 08/31/16  Herpes simplex virus(hsv) dna by pcr  Result Value Ref Range   HSV 1 DNA Negative Negative   HSV 2 DNA Negative  Negative      Assessment & Plan:   Problem List Items Addressed This Visit    None    Visit Diagnoses    Genital labial ulcer    -  Primary   Will treat with azithromycin and flagyl. Feeling sick on valtrex, so we will change to acyclovir. Lidocaine gel for comfort. Follow up 1 week.    Routine screening for STI (sexually transmitted infection)       Checking labs today, await results.    Relevant Orders   HIV antibody   GC/Chlamydia Probe Amp   RPR   Hepatitis, Acute   HSV(herpes simplex vrs) 1+2 ab-IgG       Follow up plan: Return Monday or Tuesday.

## 2016-09-04 NOTE — Telephone Encounter (Signed)
Patient would like to speak with Dr. Laural BenesJohnson.  She was seen by her today.  I explained to the patient it would not be today .  I am sure it is regarding the issue with the lidocaine.  Thank You  Clydie BraunKaren

## 2016-09-04 NOTE — Telephone Encounter (Signed)
Called and left a message for patient to return my call.  

## 2016-09-04 NOTE — Patient Instructions (Addendum)
Sexually Transmitted Disease A sexually transmitted disease (STD) is a disease or infection often passed to another person during sex. However, STDs can be passed through nonsexual ways. An STD can be passed through:  Spit (saliva).  Semen.  Blood.  Mucus from the vagina.  Pee (urine). How can I lessen my chances of getting an STD?  Use:  Latex condoms.  Water-soluble lubricants with condoms. Do not use petroleum jelly or oils.  Dental dams. These are small pieces of latex that are used as a barrier during oral sex.  Avoid having more than one sex partner.  Do not have sex with someone who has other sex partners.  Do not have sex with anyone you do not know or who is at high risk for an STD.  Avoid risky sex that can break your skin.  Do not have sex if you have open sores on your mouth or skin.  Avoid drinking too much alcohol or taking illegal drugs. Alcohol and drugs can affect your good judgment.  Avoid oral and anal sex acts.  Get shots (vaccines) for HPV and hepatitis.  If you are at risk of being infected with HIV, it is advised that you take a certain medicine daily to prevent HIV infection. This is called pre-exposure prophylaxis (PrEP). You may be at risk if:  You are a man who has sex with other men (MSM).  You are attracted to the opposite sex (heterosexual) and are having sex with more than one partner.  You take drugs with a needle.  You have sex with someone who has HIV.  Talk with your doctor about if you are at high risk of being infected with HIV. If you begin to take PrEP, get tested for HIV first. Get tested every 3 months for as long as you are taking PrEP.  Get tested for STDs every year if you are sexually active. If you are treated for an STD, get tested again 3 months after you are treated. What should I do if I think I have an STD?  See your doctor.  Tell your sex partner(s) that you have an STD. They should be tested and treated.  Do  not have sex until your doctor says it is okay. When should I get help? Get help right away if:  You have bad belly (abdominal) pain.  You are a man and have puffiness (swelling) or pain in your testicles.  You are a woman and have puffiness in your vagina. This information is not intended to replace advice given to you by your health care provider. Make sure you discuss any questions you have with your health care provider. Document Released: 09/25/2004 Document Revised: 01/24/2016 Document Reviewed: 02/11/2013 Elsevier Interactive Patient Education  2017 [employer]  

## 2016-09-04 NOTE — Telephone Encounter (Signed)
Patients mother called regarding the lidocaine was no longer available and requested another script be sent to replace this.  [facility]  Thank You Clydie BraunKaren

## 2016-09-05 ENCOUNTER — Encounter: Payer: Self-pay | Admitting: Family Medicine

## 2016-09-05 ENCOUNTER — Telehealth: Payer: Self-pay | Admitting: Family Medicine

## 2016-09-05 DIAGNOSIS — K429 Umbilical hernia without obstruction or gangrene: Secondary | ICD-10-CM | POA: Insufficient documentation

## 2016-09-05 LAB — HSV(HERPES SIMPLEX VRS) I + II AB-IGG

## 2016-09-05 LAB — HEPATITIS PANEL, ACUTE
Hep A IgM: NEGATIVE
Hep B C IgM: NEGATIVE
Hep C Virus Ab: <0.1 s/co ratio (ref 0.0–0.9)
Hepatitis B Surface Ag: NEGATIVE

## 2016-09-05 LAB — HIV ANTIBODY (ROUTINE TESTING W REFLEX): HIV Screen 4th Generation wRfx: NONREACTIVE

## 2016-09-05 LAB — RPR: RPR Ser Ql: NONREACTIVE

## 2016-09-05 NOTE — Telephone Encounter (Signed)
Pt called stated her cream has still not been sent in. Patient stated Dr. Laural BenesJohnson would know what she was talking about and hung up. Thanks.

## 2016-09-05 NOTE — Telephone Encounter (Signed)
See patient email in chart review regarding this. It has already been handled.

## 2016-09-05 NOTE — Telephone Encounter (Signed)
Called and gave her the results of her STI panel so far which has been negative. Discussed small umbilical hernia, nothing to worry about unless her belly button starts hurting.

## 2016-09-06 ENCOUNTER — Encounter: Payer: Self-pay | Admitting: Family Medicine

## 2016-09-06 LAB — GC/CHLAMYDIA PROBE AMP
Chlamydia trachomatis, NAA: NEGATIVE
Neisseria gonorrhoeae by PCR: NEGATIVE

## 2016-09-08 ENCOUNTER — Encounter: Payer: Self-pay | Admitting: Family Medicine

## 2016-09-08 ENCOUNTER — Ambulatory Visit (INDEPENDENT_AMBULATORY_CARE_PROVIDER_SITE_OTHER): Payer: Commercial Managed Care - PPO | Admitting: Family Medicine

## 2016-09-08 VITALS — BP 139/80 | HR 97 | Temp 97.7°F | Wt 190.5 lb

## 2016-09-08 DIAGNOSIS — F419 Anxiety disorder, unspecified: Secondary | ICD-10-CM

## 2016-09-08 DIAGNOSIS — H532 Diplopia: Secondary | ICD-10-CM

## 2016-09-08 DIAGNOSIS — N766 Ulceration of vulva: Secondary | ICD-10-CM | POA: Diagnosis not present

## 2016-09-08 MED ORDER — BUSPIRONE HCL 10 MG PO TABS: 5.0000 mg | ORAL_TABLET | Freq: Three times a day (TID) | ORAL | 0 refills | Status: DC | PRN

## 2016-09-08 NOTE — Progress Notes (Signed)
BP 139/80 (BP Location: Left Arm, Patient Position: Sitting, Cuff Size: Normal)   Pulse 97   Temp 97.7 F (36.5 C)   Wt 190 lb 8 oz (86.4 kg)   LMP 08/30/2016 (Exact Date)   SpO2 100%   BMI 37.20 kg/m    Subjective:    Patient ID: Beth Wagner, female    DOB: [birth year], 21 y.o.   MRN: [id number]  HPI: Beth Wagner is a 21 y.o. female  Chief Complaint  Patient presents with  . skin lesion   Not in as much pain with the lidocaine- unsure if the ulcers are improving. Not feeling like herself. Lots of stress.   ANXIETY/STRESS Duration: 2 weeks with what's been going Anxious mood: yes  Excessive worrying: yes Irritability: no  Sweating: yes Nausea: no Palpitations:yes Hyperventilation: yes Panic attacks: yes Agoraphobia: no  Obscessions/compulsions: yes Depressed mood: no Depression screen PHQ 2/9 04/29/2016  Decreased Interest 0  Down, Depressed, Hopeless 0  PHQ - 2 Score 0   Anhedonia: no Weight changes: no Insomnia: yes hard to fall asleep  Hypersomnia: no Fatigue/loss of energy: yes Feelings of worthlessness: no Feelings of guilt: no Impaired concentration/indecisiveness: yes Suicidal ideations: no  Crying spells: yes Recent Stressors/Life Changes: yes   Relationship problems: yes  Having blurred vision and double vision in her L eye- started this AM, now having trouble driving.   Relevant past medical, surgical, family and social history reviewed and updated as indicated. Interim medical history since our last visit reviewed. Allergies and medications reviewed and updated.  Review of Systems  Constitutional: Negative.   Eyes: Positive for visual disturbance. Negative for photophobia, pain, discharge, redness and itching.  Respiratory: Negative.   Cardiovascular: Negative.   Psychiatric/Behavioral: Positive for agitation, decreased concentration and sleep disturbance. Negative for behavioral problems, confusion, dysphoric mood, hallucinations,  self-injury and suicidal ideas. The patient is nervous/anxious. The patient is not hyperactive.     Per HPI unless specifically indicated above     Objective:    BP 139/80 (BP Location: Left Arm, Patient Position: Sitting, Cuff Size: Normal)   Pulse 97   Temp 97.7 F (36.5 C)   Wt 190 lb 8 oz (86.4 kg)   LMP 08/30/2016 (Exact Date)   SpO2 100%   BMI 37.20 kg/m   Wt Readings from Last 3 Encounters:  09/08/16 190 lb 8 oz (86.4 kg) (96 %, Z= 1.79)*  09/04/16 191 lb 11.2 oz (87 kg) (97 %, Z= 1.81)*  08/31/16 190 lb (86.2 kg) (96 %, Z= 1.78)*   * Growth percentiles are based on CDC 2-20 Years data.   No exam data present  Physical Exam  Constitutional: She is oriented to person, place, and time. She appears well-developed and well-nourished. No distress.  HENT:  Head: Normocephalic and atraumatic.  Right Ear: Hearing normal.  Left Ear: Hearing normal.  Nose: Nose normal.  Eyes: Conjunctivae and lids are normal. Right eye exhibits no discharge. Left eye exhibits no discharge. No scleral icterus.  Pulmonary/Chest: Effort normal. No respiratory distress.  Genitourinary:     Musculoskeletal: Normal range of motion.  Neurological: She is alert and oriented to person, place, and time.  Skin: Skin is warm, dry and intact. No rash noted. No erythema. No pallor.  Psychiatric: Her speech is normal and behavior is normal. Judgment and thought content normal. Her mood appears anxious. Cognition and memory are normal.  Nursing note and vitals reviewed.   Results for orders placed or performed in visit on  09/04/16  GC/Chlamydia Probe Amp  Result Value Ref Range   Chlamydia trachomatis, NAA Negative Negative   Neisseria gonorrhoeae by PCR Negative Negative  HIV antibody  Result Value Ref Range   HIV Screen 4th Generation wRfx Non Reactive Non Reactive  RPR  Result Value Ref Range   RPR Ser Ql Non Reactive Non Reactive  Hepatitis, Acute  Result Value Ref Range   Hep A IgM  Negative Negative   Hepatitis B Surface Ag Negative Negative   Hep B C IgM Negative Negative   Hep C Virus Ab <0.1 0.0 - 0.9 s/co ratio  HSV(herpes simplex vrs) 1+2 ab-IgG  Result Value Ref Range   HSV 1 Glycoprotein G Ab, IgG <0.91 0.00 - 0.90 index   HSV 2 Glycoprotein G Ab, IgG <0.91 0.00 - 0.90 index      Assessment & Plan:   Problem List Items Addressed This Visit    None    Visit Diagnoses    Genital labial ulcer    -  Primary   STIs negative. Ulcerations improving. Stop acyclovir. Call with any concerns. Recheck 1 week.    Double vision       Normal acuity exam today. Will get her into eye doctor.   Relevant Orders   Ambulatory referral to Ophthalmology   Acute anxiety       Offered buspar. She is not sure if she is going to take it. Rx given. Continue to monitor clostly.   Relevant Medications   busPIRone (BUSPAR) 10 MG tablet       Follow up plan: Return in about 1 week (around 09/15/2016) for Follow up .

## 2016-09-10 ENCOUNTER — Other Ambulatory Visit: Payer: Self-pay | Admitting: Ophthalmology

## 2016-09-10 DIAGNOSIS — H4711 Papilledema associated with increased intracranial pressure: Secondary | ICD-10-CM

## 2016-09-12 ENCOUNTER — Ambulatory Visit
Admission: RE | Admit: 2016-09-12 | Discharge: 2016-09-12 | Disposition: A | Discharge status: Home / Self Care | Payer: Commercial Managed Care - PPO | Source: Ambulatory Visit | Attending: Ophthalmology | Admitting: Ophthalmology

## 2016-09-12 ENCOUNTER — Other Ambulatory Visit: Payer: Self-pay | Admitting: Ophthalmology

## 2016-09-12 DIAGNOSIS — R51 Headache: Secondary | ICD-10-CM | POA: Insufficient documentation

## 2016-09-12 DIAGNOSIS — H4711 Papilledema associated with increased intracranial pressure: Secondary | ICD-10-CM | POA: Diagnosis present

## 2016-09-12 DIAGNOSIS — H4710 Unspecified papilledema: Secondary | ICD-10-CM

## 2016-09-12 MED ORDER — GADOBENATE DIMEGLUMINE 529 MG/ML IV SOLN
20.0000 mL | Freq: Once | INTRAVENOUS | Status: AC | PRN
Start: 2016-09-12 — End: 2016-09-12
  Administered 2016-09-12: 18 mL via INTRAVENOUS

## 2016-09-17 ENCOUNTER — Ambulatory Visit
Admission: RE | Admit: 2016-09-17 | Discharge: 2016-09-17 | Disposition: A | Discharge status: Home / Self Care | Payer: Commercial Managed Care - PPO | Source: Ambulatory Visit | Attending: Ophthalmology | Admitting: Ophthalmology

## 2016-09-17 DIAGNOSIS — H4710 Unspecified papilledema: Secondary | ICD-10-CM | POA: Diagnosis not present

## 2016-09-17 HISTORY — DX: Headache: R51

## 2016-09-17 HISTORY — DX: Headache, unspecified: R51.9

## 2016-09-17 LAB — PREGNANCY, URINE: PREG TEST UR: NEGATIVE

## 2016-09-17 MED ORDER — ACETAMINOPHEN 325 MG PO TABS
650.0000 mg | ORAL_TABLET | ORAL | Status: DC | PRN
  Filled 2016-09-17: qty 2

## 2016-09-17 NOTE — Discharge Instructions (Signed)
Lumbar Puncture, Care After °Refer to this sheet in the next few weeks. These instructions provide you with information on caring for yourself after your procedure. Your health care provider may also give you more specific instructions. Your treatment has been planned according to current medical practices, but problems sometimes occur. Call your health care provider if you have any problems or questions after your procedure. °What can I expect after the procedure? °After your procedure, it is typical to have the following sensations: °· Mild discomfort or pain at the insertion site. °· Mild headache that is relieved with pain medicines. ° °Follow these instructions at home: ° °· Avoid lifting anything heavier than 10 lb (4.5 kg) for at least 12 hours after the procedure. °· Drink enough fluids to keep your urine clear or pale yellow. °Contact a health care provider if: °· You have fever or chills. °· You have nausea or vomiting. °· You have a headache that lasts for more than 2 days. °Get help right away if: °· You have any numbness or tingling in your legs. °· You are unable to control your bowel or bladder. °· You have bleeding or swelling in your back at the insertion site. °· You are dizzy or faint. °This information is not intended to replace advice given to you by your health care provider. Make sure you discuss any questions you have with your health care provider. °Document Released: 08/23/2013 Document Revised: 01/24/2016 Document Reviewed: 04/26/2013 °Elsevier Interactive Patient Education © 2017 Elsevier Inc. ° °

## 2016-09-19 ENCOUNTER — Encounter: Payer: Self-pay | Admitting: Family Medicine

## 2016-09-19 ENCOUNTER — Other Ambulatory Visit: Payer: Self-pay | Admitting: Family Medicine

## 2016-09-20 LAB — CSF CULTURE: CULTURE: NO GROWTH

## 2016-09-20 LAB — CSF CULTURE W GRAM STAIN: Gram Stain: NONE SEEN

## 2016-10-13 ENCOUNTER — Ambulatory Visit (INDEPENDENT_AMBULATORY_CARE_PROVIDER_SITE_OTHER): Payer: Commercial Managed Care - PPO | Admitting: Family Medicine

## 2016-10-13 ENCOUNTER — Encounter: Payer: Self-pay | Admitting: Family Medicine

## 2016-10-13 VITALS — BP 125/66 | HR 94 | Temp 98.5°F | Resp 17 | Ht 60.0 in | Wt 191.0 lb

## 2016-10-13 DIAGNOSIS — N766 Ulceration of vulva: Secondary | ICD-10-CM | POA: Diagnosis not present

## 2016-10-13 DIAGNOSIS — N760 Acute vaginitis: Secondary | ICD-10-CM

## 2016-10-13 DIAGNOSIS — B373 Candidiasis of vulva and vagina: Secondary | ICD-10-CM | POA: Diagnosis not present

## 2016-10-13 DIAGNOSIS — B9689 Other specified bacterial agents as the cause of diseases classified elsewhere: Secondary | ICD-10-CM | POA: Diagnosis not present

## 2016-10-13 DIAGNOSIS — N898 Other specified noninflammatory disorders of vagina: Secondary | ICD-10-CM

## 2016-10-13 DIAGNOSIS — B3731 Acute candidiasis of vulva and vagina: Secondary | ICD-10-CM

## 2016-10-13 LAB — WET PREP FOR TRICH, YEAST, CLUE
Clue Cell Exam: POSITIVE — AB
Trichomonas Exam: NEGATIVE
Yeast Exam: POSITIVE — AB

## 2016-10-13 MED ORDER — FLUCONAZOLE 150 MG PO TABS
150.0000 mg | ORAL_TABLET | Freq: Every day | ORAL | 0 refills | Status: DC
Start: 2016-10-13 — End: 2017-03-24

## 2016-10-13 MED ORDER — METRONIDAZOLE 500 MG PO TABS: 500.0000 mg | ORAL_TABLET | Freq: Two times a day (BID) | ORAL | 0 refills | Status: DC

## 2016-10-13 NOTE — Telephone Encounter (Signed)
Routing to provider  

## 2016-10-13 NOTE — Progress Notes (Signed)
BP 125/66 (BP Location: Right Arm, Patient Position: Sitting, Cuff Size: Normal)   Pulse 94   Temp 98.5 F (36.9 C) (Oral)   Resp 17   Ht 5' (1.524 m)   Wt 191 lb (86.6 kg)   LMP 09/27/2016   SpO2 99%   BMI 37.30 kg/m    Subjective:    Patient ID: Beth Wagner, female    DOB: June 14, 1996, 21 y.o.   MRN: [id number]  HPI: Beth Wagner is a 21 y.o. female  Chief Complaint  Patient presents with  . Re check genital labial ulcer   Feeling 100% better. Notes that she had been using an old razor and had cut herself. No longer with any pain. Feeling much much better.   VAGINAL DISCHARGE Duration: days Discharge description: white  Pruritus: no Dysuria: no Malodorous: no Urinary frequency: no Fevers: no Abdominal pain: no  Sexual activity: practicing safe sex History of sexually transmitted diseases: no Recent antibiotic use: yes Context: stable  Treatments attempted: none   Relevant past medical, surgical, family and social history reviewed and updated as indicated. Interim medical history since our last visit reviewed. Allergies and medications reviewed and updated.  Review of Systems  Constitutional: Negative.   Respiratory: Negative.   Cardiovascular: Negative.   Genitourinary: Negative.   Psychiatric/Behavioral: Negative.     Per HPI unless specifically indicated above     Objective:    BP 125/66 (BP Location: Right Arm, Patient Position: Sitting, Cuff Size: Normal)   Pulse 94   Temp 98.5 F (36.9 C) (Oral)   Resp 17   Ht 5' (1.524 m)   Wt 191 lb (86.6 kg)   LMP 09/27/2016   SpO2 99%   BMI 37.30 kg/m   Wt Readings from Last 3 Encounters:  10/13/16 191 lb (86.6 kg) (96 %, Z= 1.80)*  09/17/16 190 lb (86.2 kg) (96 %, Z= 1.78)*  09/08/16 190 lb 8 oz (86.4 kg) (96 %, Z= 1.79)*   * Growth percentiles are based on CDC 2-20 Years data.    Physical Exam  Constitutional: She is oriented to person, place, and time. She appears well-developed and  well-nourished. No distress.  HENT:  Head: Normocephalic and atraumatic.  Right Ear: Hearing normal.  Left Ear: Hearing normal.  Nose: Nose normal.  Eyes: Conjunctivae and lids are normal. Right eye exhibits no discharge. Left eye exhibits no discharge. No scleral icterus.  Pulmonary/Chest: Effort normal. No respiratory distress.  Abdominal: Hernia confirmed negative in the right inguinal area and confirmed negative in the left inguinal area.  Genitourinary: No labial fusion. There is no rash, tenderness, lesion or injury on the right labia. There is no rash, tenderness, lesion or injury on the left labia. No erythema, tenderness or bleeding in the vagina. No foreign body in the vagina. No signs of injury around the vagina. Vaginal discharge found.  Musculoskeletal: Normal range of motion.  Neurological: She is alert and oriented to person, place, and time.  Skin: Skin is intact. No rash noted.  Psychiatric: She has a normal mood and affect. Her speech is normal and behavior is normal. Judgment and thought content normal. Cognition and memory are normal.  Nursing note and vitals reviewed.   Results for orders placed or performed during the hospital encounter of 09/17/16  CSF culture  Result Value Ref Range   Specimen Description CSF    Special Requests NONE    Gram Stain      NO ORGANISMS SEEN NO WBC SEEN  NO RED BLOOD CELLS    Culture      NO GROWTH 3 DAYS Performed at [facility], [address], [place], [place] [address]    Report Status 09/20/2016 FINAL   Pregnancy, urine  Result Value Ref Range   Preg Test, Ur NEGATIVE NEGATIVE      Assessment & Plan:   Problem List Items Addressed This Visit    None    Visit Diagnoses    Vaginal discharge    -  Primary   Will check wet prep. + clue cells and yeast   Relevant Orders   WET PREP FOR TRICH, YEAST, CLUE   Genital labial ulcer       Resolved. Continue to monitor.    BV (bacterial vaginosis)       Will treat  with metronidazole. Call with any concerns.    Relevant Medications   metroNIDAZOLE (FLAGYL) 500 MG tablet   fluconazole (DIFLUCAN) 150 MG tablet   Yeast infection of the vagina       Will treat with diflucan. Call with any concerns.    Relevant Medications   metroNIDAZOLE (FLAGYL) 500 MG tablet   fluconazole (DIFLUCAN) 150 MG tablet       Follow up plan: Return August, for Physical.

## 2016-10-13 NOTE — Patient Instructions (Addendum)
Bacterial Vaginosis Bacterial vaginosis is a vaginal infection that occurs when the normal balance of bacteria in the vagina is disrupted. It results from an overgrowth of certain bacteria. This is the most common vaginal infection among women ages 15-44. Because bacterial vaginosis increases your risk for STIs (sexually transmitted infections), getting treated can help reduce your risk for chlamydia, gonorrhea, herpes, and HIV (human immunodeficiency virus). Treatment is also important for preventing complications in pregnant women, because this condition can cause an early (premature) delivery. What are the causes? This condition is caused by an increase in harmful bacteria that are normally present in small amounts in the vagina. However, the reason that the condition develops is not fully understood. What increases the risk? The following factors may make you more likely to develop this condition:  Having a new sexual partner or multiple sexual partners.  Having unprotected sex.  Douching.  Having an intrauterine device (IUD).  Smoking.  Drug and alcohol abuse.  Taking certain antibiotic medicines.  Being pregnant. You cannot get bacterial vaginosis from toilet seats, bedding, swimming pools, or contact with objects around you. What are the signs or symptoms? Symptoms of this condition include:  Grey or white vaginal discharge. The discharge can also be watery or foamy.  A fish-like odor with discharge, especially after sexual intercourse or during menstruation.  Itching in and around the vagina.  Burning or pain with urination. Some women with bacterial vaginosis have no signs or symptoms. How is this diagnosed? This condition is diagnosed based on:  Your medical history.  A physical exam of the vagina.  Testing a sample of vaginal fluid under a microscope to look for a large amount of bad bacteria or abnormal cells. Your health care provider may use a cotton swab or a  small wooden spatula to collect the sample. How is this treated? This condition is treated with antibiotics. These may be given as a pill, a vaginal cream, or a medicine that is put into the vagina (suppository). If the condition comes back after treatment, a second round of antibiotics may be needed. Follow these instructions at home: Medicines   Take over-the-counter and prescription medicines only as told by your health care provider.  Take or use your antibiotic as told by your health care provider. Do not stop taking or using the antibiotic even if you start to feel better. General instructions   If you have a female sexual partner, tell her that you have a vaginal infection. She should see her health care provider and be treated if she has symptoms. If you have a female sexual partner, he does not need treatment.  During treatment:  Avoid sexual activity until you finish treatment.  Do not douche.  Avoid alcohol as directed by your health care provider.  Avoid breastfeeding as directed by your health care provider.  Drink enough water and fluids to keep your urine clear or pale yellow.  Keep the area around your vagina and rectum clean.  Wash the area daily with warm water.  Wipe yourself from front to back after using the toilet.  Keep all follow-up visits as told by your health care provider. This is important. How is this prevented?  Do not douche.  Wash the outside of your vagina with warm water only.  Use protection when having sex. This includes latex condoms and dental dams.  Limit how many sexual partners you have. To help prevent bacterial vaginosis, it is best to have sex with just one   partner (monogamous).  Make sure you and your sexual partner are tested for STIs.  Wear cotton or cotton-lined underwear.  Avoid wearing tight pants and pantyhose, especially during summer.  Limit the amount of alcohol that you drink.  Do not use any products that contain  nicotine or tobacco, such as cigarettes and e-cigarettes. If you need help quitting, ask your health care provider.  Do not use illegal drugs. Where to find more information:  Centers for Disease Control and Prevention: www.cdc.gov/std  American Sexual Health Association (ASHA): www.ashastd.org  U.S. Department of Health and Human Services, Office on Women's Health: www.womenshealth.gov/ or https://www.womenshealth.gov/a-z-topics/bacterial-vaginosis Contact a health care provider if:  Your symptoms do not improve, even after treatment.  You have more discharge or pain when urinating.  You have a fever.  You have pain in your abdomen.  You have pain during sex.  You have vaginal bleeding between periods. Summary  Bacterial vaginosis is a vaginal infection that occurs when the normal balance of bacteria in the vagina is disrupted.  Because bacterial vaginosis increases your risk for STIs (sexually transmitted infections), getting treated can help reduce your risk for chlamydia, gonorrhea, herpes, and HIV (human immunodeficiency virus). Treatment is also important for preventing complications in pregnant women, because the condition can cause an early (premature) delivery.  This condition is treated with antibiotic medicines. These may be given as a pill, a vaginal cream, or a medicine that is put into the vagina (suppository). This information is not intended to replace advice given to you by your health care provider. Make sure you discuss any questions you have with your health care provider. Document Released: 08/18/2005 Document Revised: 05/03/2016 Document Reviewed: 05/03/2016 Elsevier Interactive Patient Education  2017 Elsevier Inc. Vaginal Yeast infection, Adult Vaginal yeast infection is a condition that causes soreness, swelling, and redness (inflammation) of the vagina. It also causes vaginal discharge. This is a common condition. Some women get this infection  frequently. What are the causes? This condition is caused by a change in the normal balance of the yeast (candida) and bacteria that live in the vagina. This change causes an overgrowth of yeast, which causes the inflammation. What increases the risk? This condition is more likely to develop in:  Women who take antibiotic medicines.  Women who have diabetes.  Women who take birth control pills.  Women who are pregnant.  Women who douche often.  Women who have a weak defense (immune) system.  Women who have been taking steroid medicines for a long time.  Women who frequently wear tight clothing. What are the signs or symptoms? Symptoms of this condition include:  White, thick vaginal discharge.  Swelling, itching, redness, and irritation of the vagina. The lips of the vagina (vulva) may be affected as well.  Pain or a burning feeling while urinating.  Pain during sex. How is this diagnosed? This condition is diagnosed with a medical history and physical exam. This will include a pelvic exam. Your health care provider will examine a sample of your vaginal discharge under a microscope. Your health care provider may send this sample for testing to confirm the diagnosis. How is this treated? This condition is treated with medicine. Medicines may be over-the-counter or prescription. You may be told to use one or more of the following:  Medicine that is taken orally.  Medicine that is applied as a cream.  Medicine that is inserted directly into the vagina (suppository). Follow these instructions at home:  Take or apply over-the-counter   and prescription medicines only as told by your health care provider.  Do not have sex until your health care provider has approved. Tell your sex partner that you have a yeast infection. That person should go to his or her health care provider if he or she develops symptoms.  Do not wear tight clothes, such as pantyhose or tight pants.  Avoid  using tampons until your health care provider approves.  Eat more yogurt. This may help to keep your yeast infection from returning.  Try taking a sitz bath to help with discomfort. This is a warm water bath that is taken while you are sitting down. The water should only come up to your hips and should cover your buttocks. Do this 3-4 times per day or as told by your health care provider.  Do not douche.  Wear breathable, cotton underwear.  If you have diabetes, keep your blood sugar levels under control. Contact a health care provider if:  You have a fever.  Your symptoms go away and then return.  Your symptoms do not get better with treatment.  Your symptoms get worse.  You have new symptoms.  You develop blisters in or around your vagina.  You have blood coming from your vagina and it is not your menstrual period.  You develop pain in your abdomen. This information is not intended to replace advice given to you by your health care provider. Make sure you discuss any questions you have with your health care provider. Document Released: 05/28/2005 Document Revised: 01/30/2016 Document Reviewed: 02/19/2015 Elsevier Interactive Patient Education  2017 Elsevier Inc.  

## 2016-10-14 ENCOUNTER — Encounter: Payer: Self-pay | Admitting: Family Medicine

## 2016-10-21 ENCOUNTER — Encounter: Payer: Self-pay | Admitting: Family Medicine

## 2016-10-23 ENCOUNTER — Ambulatory Visit (INDEPENDENT_AMBULATORY_CARE_PROVIDER_SITE_OTHER): Payer: Commercial Managed Care - PPO | Admitting: Family Medicine

## 2016-10-23 ENCOUNTER — Encounter: Payer: Self-pay | Admitting: Family Medicine

## 2016-10-23 VITALS — BP 110/88 | HR 85 | Temp 98.6°F | Resp 17 | Ht 60.0 in | Wt 192.0 lb

## 2016-10-23 DIAGNOSIS — H6592 Unspecified nonsuppurative otitis media, left ear: Secondary | ICD-10-CM

## 2016-10-23 MED ORDER — FLUTICASONE PROPIONATE 50 MCG/ACT NA SUSP: 2.0000 | Freq: Two times a day (BID) | NASAL | 6 refills | Status: DC

## 2016-10-23 MED ORDER — PSEUDOEPHEDRINE HCL 30 MG PO TABS: 30.0000 mg | ORAL_TABLET | Freq: Four times a day (QID) | ORAL | 0 refills | Status: DC | PRN

## 2016-10-23 NOTE — Progress Notes (Signed)
   BP 110/88 (BP Location: Left Arm, Patient Position: Sitting, Cuff Size: Normal)   Pulse 85   Temp 98.6 F (37 C) (Oral)   Resp 17   Ht 5' (1.524 m)   Wt 192 lb (87.1 kg)   LMP 09/27/2016   SpO2 100%   BMI 37.50 kg/m    Subjective:    Patient ID: Beth Wagner, female    DOB: [birth year], 21 y.o.   MRN: [id number]  HPI: Beth FickDondra Ayyad is a 21 y.o. female  Chief Complaint  Patient presents with  . Ear Fullness    Left ear on and off can hear a "whooshing" onset 1 month   Left ear "whooshing" intermittently for about a month. Noticing it mostly when moving her head around. Right ear has been completely fine. Denies pressure, pain, muffled hearing, congestion, cough, fever, dizziness. Has not been trying anything for sxs. No reported hx of recent URI, does have hx of allergies not currently being treated by any medications.   Relevant past medical, surgical, family and social history reviewed and updated as indicated. Interim medical history since our last visit reviewed. Allergies and medications reviewed and updated.  Review of Systems  Constitutional: Negative.   HENT:       Ear fullness  Respiratory: Negative.   Cardiovascular: Negative.   Gastrointestinal: Negative.   Genitourinary: Negative.   Musculoskeletal: Negative.   Neurological: Negative.   Psychiatric/Behavioral: Negative.     Per HPI unless specifically indicated above     Objective:    BP 110/88 (BP Location: Left Arm, Patient Position: Sitting, Cuff Size: Normal)   Pulse 85   Temp 98.6 F (37 C) (Oral)   Resp 17   Ht 5' (1.524 m)   Wt 192 lb (87.1 kg)   LMP 09/27/2016   SpO2 100%   BMI 37.50 kg/m   Wt Readings from Last 3 Encounters:  10/23/16 192 lb (87.1 kg) (97 %, Z= 1.81)*  10/13/16 191 lb (86.6 kg) (96 %, Z= 1.80)*  09/17/16 190 lb (86.2 kg) (96 %, Z= 1.78)*   * Growth percentiles are based on CDC 2-20 Years data.    Physical Exam  Constitutional: She is oriented to person, place,  and time. She appears well-developed and well-nourished. No distress.  HENT:  Head: Atraumatic.  Right Ear: External ear normal.  Left Ear: External ear normal.  Nose: Nose normal.  Mouth/Throat: Oropharynx is clear and moist. No oropharyngeal exudate.  Mild effusion of left ear  Eyes: Conjunctivae are normal. Pupils are equal, round, and reactive to light. No scleral icterus.  Neck: Normal range of motion. Neck supple.  Cardiovascular: Normal rate and normal heart sounds.   Pulmonary/Chest: Effort normal and breath sounds normal. No respiratory distress.  Musculoskeletal: Normal range of motion.  Neurological: She is alert and oriented to person, place, and time.  Skin: Skin is warm and dry.  Psychiatric: She has a normal mood and affect. Her behavior is normal.  Nursing note and vitals reviewed.     Assessment & Plan:   Problem List Items Addressed This Visit    None    Visit Diagnoses    Left otitis media with effusion    -  Primary   Suspect allergy related. Recommended flonase and zyrtec daily, and sudafed prn. Return precautions given for signs of infection.        Follow up plan: Return for Physical Exam as scheduled.

## 2016-10-23 NOTE — Patient Instructions (Signed)
Debrox drops for ear wax over the counter

## 2016-11-25 ENCOUNTER — Encounter: Payer: Self-pay | Admitting: Family Medicine

## 2017-02-15 ENCOUNTER — Encounter: Payer: Self-pay | Admitting: Family Medicine

## 2017-03-24 ENCOUNTER — Encounter: Payer: Self-pay | Admitting: Family Medicine

## 2017-03-24 ENCOUNTER — Ambulatory Visit (INDEPENDENT_AMBULATORY_CARE_PROVIDER_SITE_OTHER): Payer: Commercial Managed Care - PPO | Admitting: Family Medicine

## 2017-03-24 VITALS — BP 128/86 | HR 103 | Temp 98.6°F | Wt 201.0 lb

## 2017-03-24 DIAGNOSIS — N760 Acute vaginitis: Secondary | ICD-10-CM

## 2017-03-24 DIAGNOSIS — B9689 Other specified bacterial agents as the cause of diseases classified elsewhere: Secondary | ICD-10-CM

## 2017-03-24 MED ORDER — METRONIDAZOLE 500 MG PO TABS: 500.0000 mg | ORAL_TABLET | Freq: Two times a day (BID) | ORAL | 0 refills | Status: DC

## 2017-03-24 NOTE — Patient Instructions (Signed)
Eucerin, aquaphor, and cera ve make good cleansers Can also use the Dove unscented bar

## 2017-03-24 NOTE — Progress Notes (Signed)
BP 128/86   Pulse (!) 103   Temp 98.6 F (37 C)   Wt 201 lb (91.2 kg)   LMP 03/11/2017 (Exact Date)   SpO2 99%   BMI 39.26 kg/m    Subjective:    Patient ID: Beth Wagner, female    DOB: Dec 01, 1995, 21 y.o.   MRN: [id number]  HPI: Beth Wagner is a 21 y.o. female  Chief Complaint  Patient presents with  . Vaginal Discharge    noticing some changes when she is ovulating,. Noticing an odor and some discharge. Wants to make sure it all is normal. No itching or burning. No pelvic pain.   Patient presents with about a week of vaginal discharge, metallic/unpleasant odor, and irritation. Denies itching, burning, lesions, dysuria, abdominal or pelvic pain. Never been sexually active. Does have a hx of BV and a vaginal ulcer of unclear etiology that resolved with doxy. States she washes with Summer's Eve products lately but feels like they may be causing some of the issue.   Relevant past medical, surgical, family and social history reviewed and updated as indicated. Interim medical history since our last visit reviewed. Allergies and medications reviewed and updated.  Review of Systems  Constitutional: Negative.   Respiratory: Negative.   Cardiovascular: Negative.   Gastrointestinal: Negative.   Genitourinary: Positive for vaginal discharge and vaginal pain.       Vaginal odor  Musculoskeletal: Negative.   Neurological: Negative.   Psychiatric/Behavioral: Negative.    Per HPI unless specifically indicated above     Objective:    BP 128/86   Pulse (!) 103   Temp 98.6 F (37 C)   Wt 201 lb (91.2 kg)   LMP 03/11/2017 (Exact Date)   SpO2 99%   BMI 39.26 kg/m   Wt Readings from Last 3 Encounters:  03/24/17 201 lb (91.2 kg)  10/23/16 192 lb (87.1 kg) (97 %, Z= 1.81)*  10/13/16 191 lb (86.6 kg) (96 %, Z= 1.80)*   * Growth percentiles are based on CDC 2-20 Years data.    Physical Exam  Constitutional: She is oriented to person, place, and time. She appears  well-developed and well-nourished. No distress.  HENT:  Head: Atraumatic.  Eyes: Pupils are equal, round, and reactive to light. Conjunctivae are normal.  Neck: Normal range of motion. Neck supple.  Cardiovascular: Normal rate and normal heart sounds.   Pulmonary/Chest: Effort normal and breath sounds normal.  Abdominal: Soft. Bowel sounds are normal. She exhibits no distension. There is no tenderness.  Genitourinary: Vaginal discharge found.  Musculoskeletal: Normal range of motion.  Neurological: She is alert and oriented to person, place, and time.  Skin: Skin is warm and dry.  Psychiatric: She has a normal mood and affect. Her behavior is normal.  Nursing note and vitals reviewed.  Results for orders placed or performed in visit on 10/13/16  WET PREP FOR TRICH, YEAST, CLUE  Result Value Ref Range   Trichomonas Exam Negative Negative   Yeast Exam Positive (A) Negative   Clue Cell Exam Positive (A) Negative      Assessment & Plan:   Problem List Items Addressed This Visit    None    Visit Diagnoses    BV (bacterial vaginosis)    -  Primary   Wet prep + for clue cells. Will treat with flagyl, d/c summer's eve products. Good hygiene and probiotic discussed. F/u if worsening or no improvement   Relevant Medications   metroNIDAZOLE (FLAGYL) 500 MG tablet  Other Relevant Orders   WET PREP FOR TRICH, YEAST, CLUE      Follow up plan: Return for as scheduled.

## 2017-03-26 LAB — WET PREP FOR TRICH, YEAST, CLUE
CLUE CELL EXAM: POSITIVE — AB
TRICHOMONAS EXAM: NEGATIVE
YEAST EXAM: NEGATIVE

## 2017-03-26 IMAGING — CT CT ABD-PELV W/ CM
1 of 2 series · 16 of 32 positions shown, 20 images · IV contrast (iopamidol)
Comparison: None.

CLINICAL DATA: Acute lower abdominal pain.

EXAM:
CT ABDOMEN AND PELVIS WITH CONTRAST
TECHNIQUE: Multidetector CT imaging of the abdomen and pelvis was performed
using the standard protocol following bolus administration of
intravenous contrast.
CONTRAST:  100mL N5SSR4-BUU IOPAMIDOL (N5SSR4-BUU) INJECTION 61%

[Series 2: routine abd pel with · axial · 0.87mm/px · z∈[-401,+24]mm · 16 of 93 slices shown, 20 images]
[im 4/93  soft-tissue]
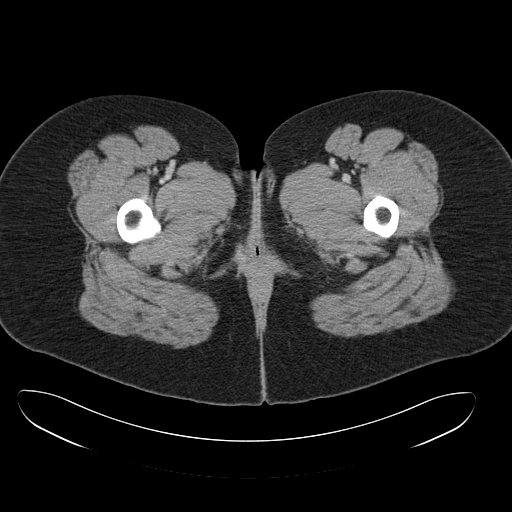
[im 4/93  bone]
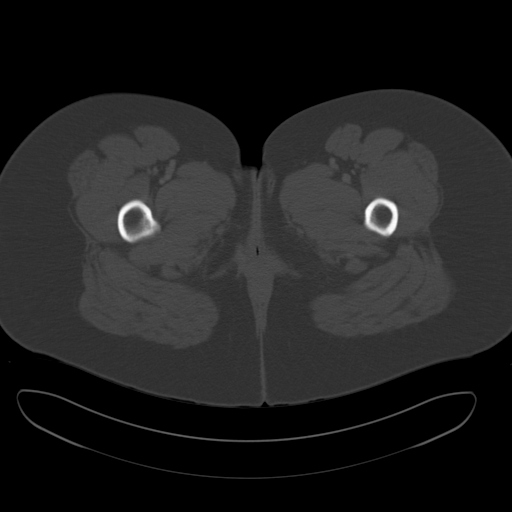
[im 12/93  soft-tissue]
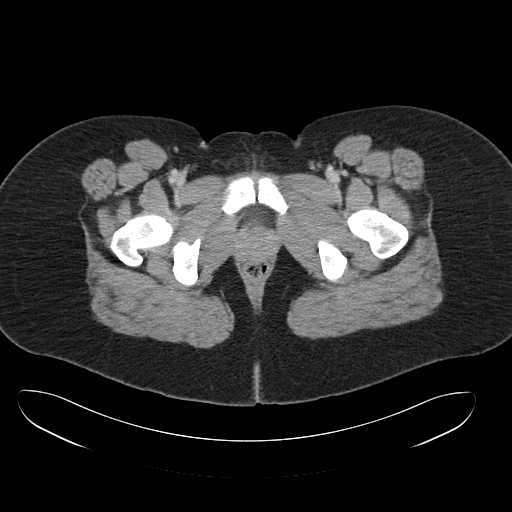
[im 19/93  soft-tissue]
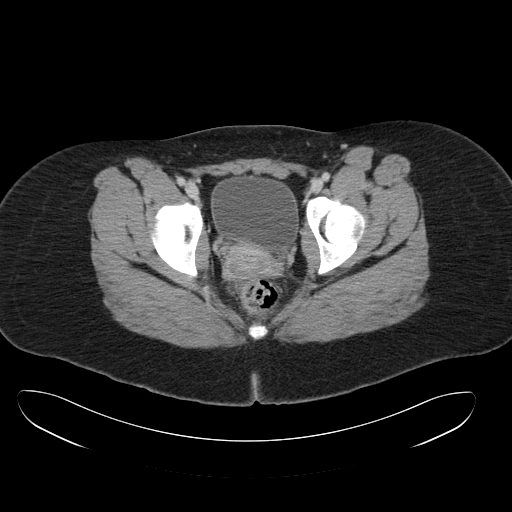
[im 26/93  soft-tissue]
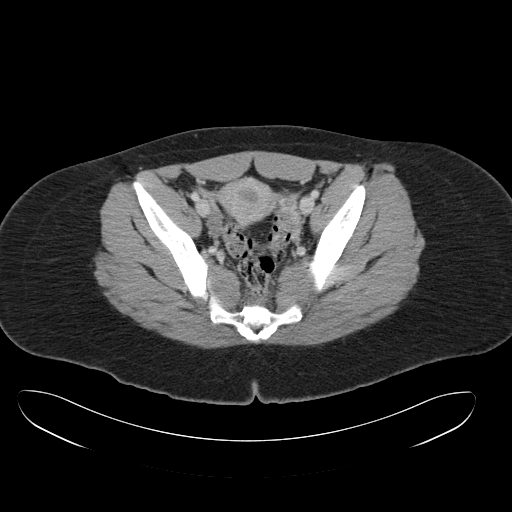
[im 30/93  soft-tissue]
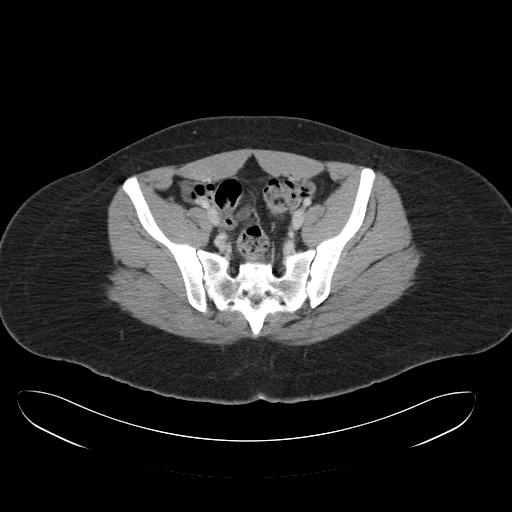
[im 37/93  soft-tissue]
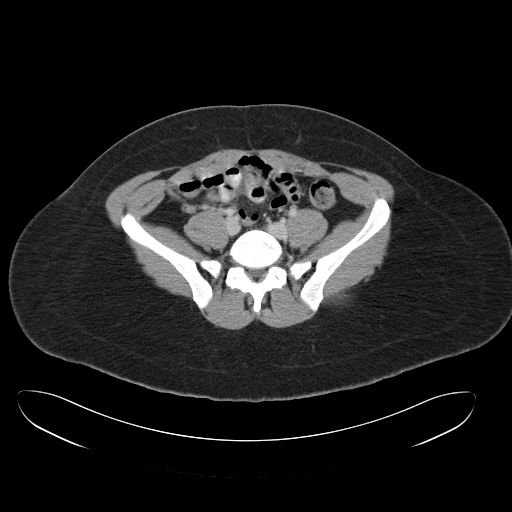
[im 45/93  soft-tissue]
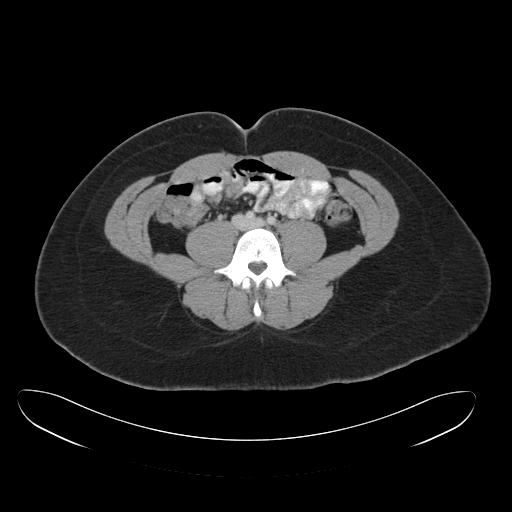
[im 48/93  soft-tissue]
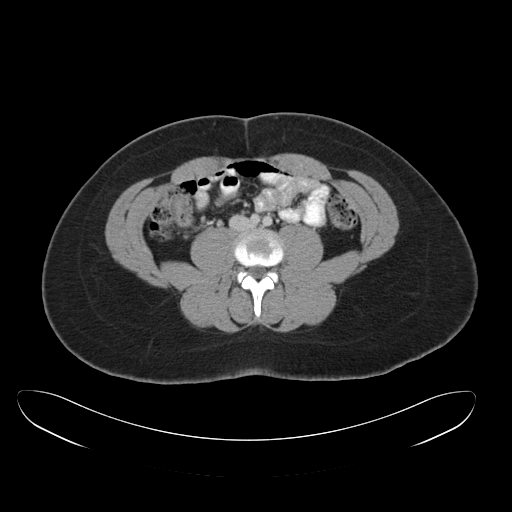
[im 56/93  soft-tissue]
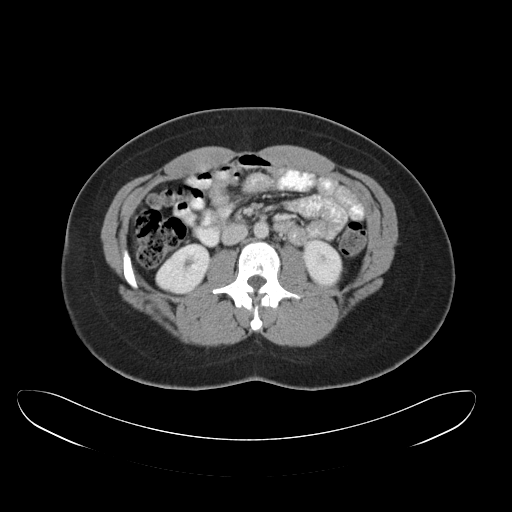
[im 56/93  bone]
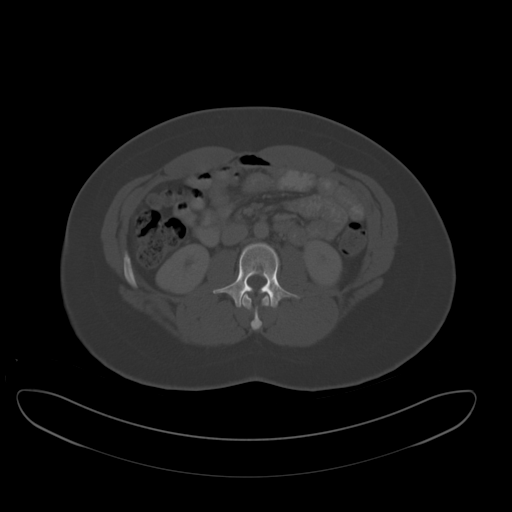
[im 63/93  soft-tissue]
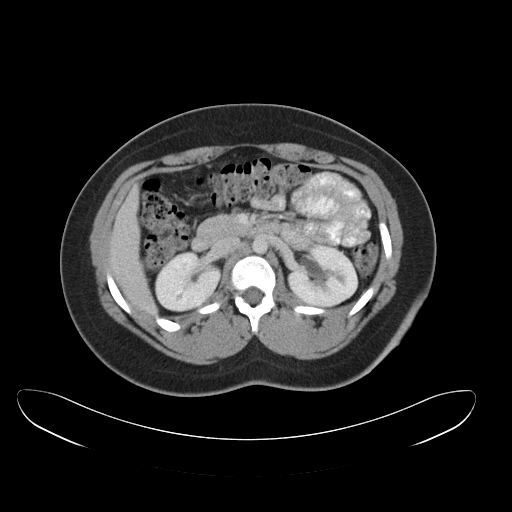
[im 70/93  soft-tissue]
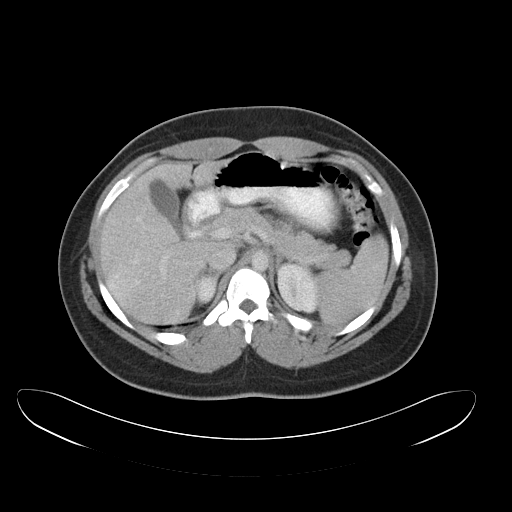
[im 74/93  soft-tissue]
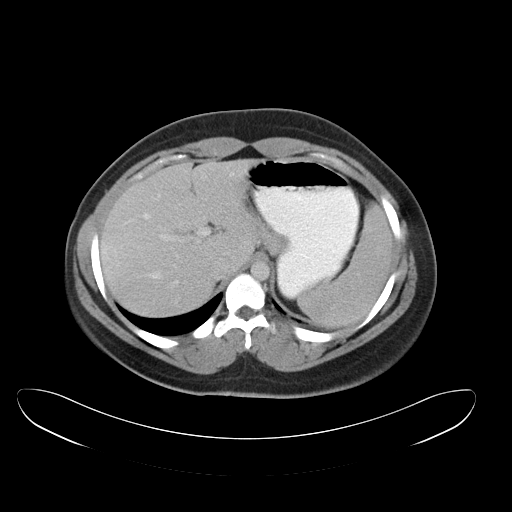
[im 78/93  lung]
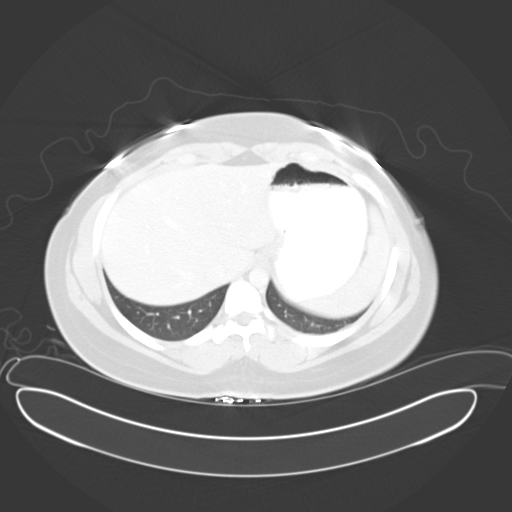
[im 81/93  soft-tissue]
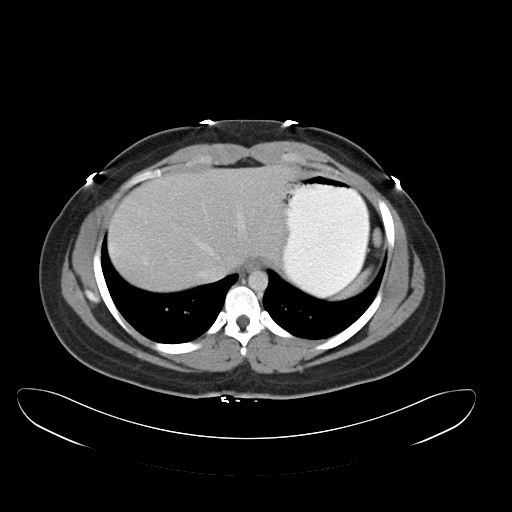
[im 81/93  lung]
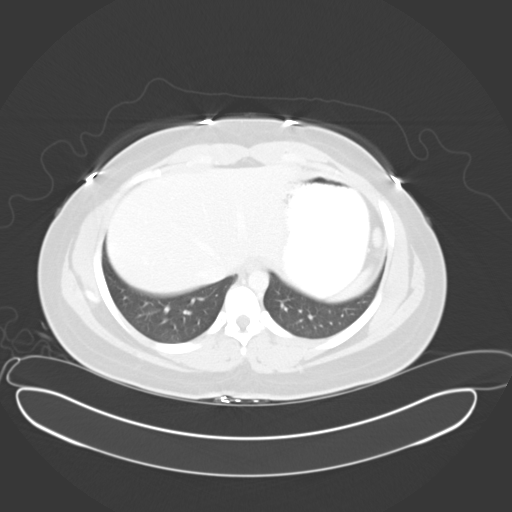
[im 85/93  lung]
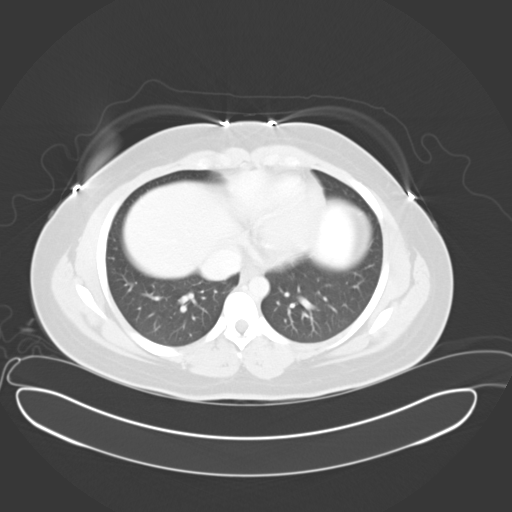
[im 89/93  soft-tissue]
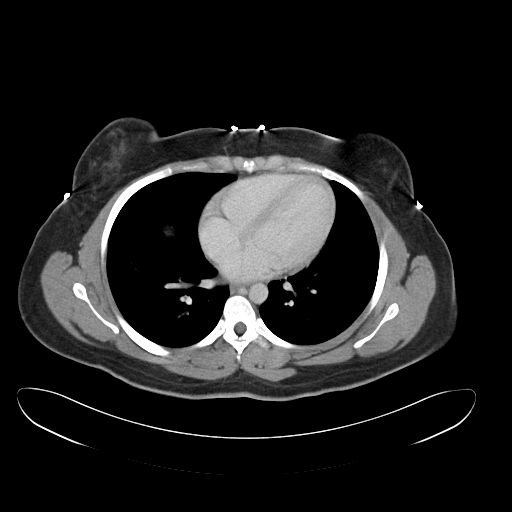
[im 89/93  lung]
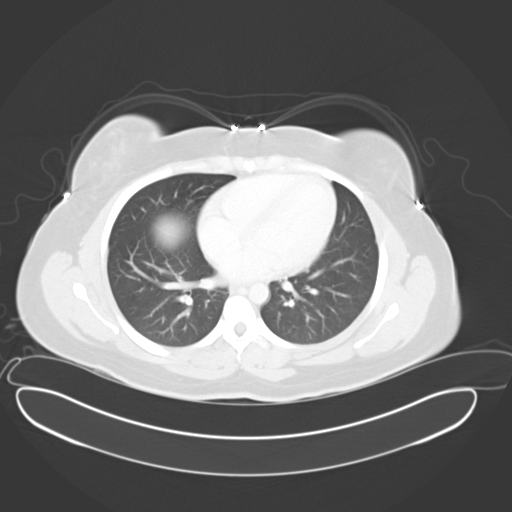

[16 of 32 positions shown; findings below may reference images not displayed]

FINDINGS: Visualized lung bases are unremarkable. No significant osseous
abnormality is noted.

No gallstones are noted. The liver, spleen and pancreas are
unremarkable. Adrenal glands and kidneys are unremarkable. No
hydronephrosis or renal obstruction is noted. The appendix appears
normal. There is no evidence of bowel obstruction. No abnormal fluid
collection is noted. Urinary bladder appears normal. 2.5 cm left
ovarian cyst is noted. Uterus and ovaries are otherwise
unremarkable. No significant adenopathy is noted.
IMPRESSION: No significant abnormality seen in the abdomen or pelvis.

## 2017-03-28 ENCOUNTER — Encounter: Payer: Self-pay | Admitting: Family Medicine

## 2017-05-01 ENCOUNTER — Encounter: Payer: Self-pay | Admitting: Family Medicine

## 2017-05-01 ENCOUNTER — Ambulatory Visit (INDEPENDENT_AMBULATORY_CARE_PROVIDER_SITE_OTHER): Payer: Commercial Managed Care - PPO | Admitting: Family Medicine

## 2017-05-01 VITALS — BP 126/86 | HR 97 | Temp 99.4°F | Ht 61.4 in | Wt 200.6 lb

## 2017-05-01 DIAGNOSIS — Z833 Family history of diabetes mellitus: Secondary | ICD-10-CM | POA: Diagnosis not present

## 2017-05-01 DIAGNOSIS — Z8249 Family history of ischemic heart disease and other diseases of the circulatory system: Secondary | ICD-10-CM

## 2017-05-01 DIAGNOSIS — Z0000 Encounter for general adult medical examination without abnormal findings: Secondary | ICD-10-CM | POA: Diagnosis not present

## 2017-05-01 DIAGNOSIS — E669 Obesity, unspecified: Secondary | ICD-10-CM | POA: Diagnosis not present

## 2017-05-01 LAB — UA/M W/RFLX CULTURE, ROUTINE
Bilirubin, UA: NEGATIVE
Glucose, UA: NEGATIVE
Ketones, UA: NEGATIVE
Leukocytes, UA: NEGATIVE
Nitrite, UA: NEGATIVE
Protein, UA: NEGATIVE
RBC, UA: NEGATIVE
Specific Gravity, UA: 1.015 (ref 1.005–1.030)
Urobilinogen, Ur: 0.2 mg/dL (ref 0.2–1.0)
pH, UA: 6.5 (ref 5.0–7.5)

## 2017-05-01 NOTE — Patient Instructions (Addendum)

## 2017-05-01 NOTE — Progress Notes (Signed)
BP 126/86 (BP Location: Left Arm, Patient Position: Sitting, Cuff Size: Large)   Pulse 97   Temp 99.4 F (37.4 C)   Ht 5' 1.4" (1.56 m)   Wt 200 lb 9 oz (91 kg)   LMP 04/08/2017 (Exact Date)   SpO2 100%   BMI 37.40 kg/m    Subjective:    Patient ID: Beth Wagner, female    DOB: [birth year], 21 y.o.   MRN: [id number]  HPI: Beth Wagner is a 21 y.o. female presenting on 05/01/2017 for comprehensive medical examination. Current medical complaints include:none  She currently lives with: Parents Menopausal Symptoms: no  Depression Screen done today and results listed below:  Depression screen [facility] 2/9 05/01/2017 04/29/2016  Decreased Interest 0 0  Down, Depressed, Hopeless 0 0  PHQ - 2 Score 0 0  Altered sleeping 0 -  Tired, decreased energy 0 -  Change in appetite 0 -  Feeling bad or failure about yourself  0 -  Trouble concentrating 0 -  Moving slowly or fidgety/restless 0 -  Suicidal thoughts 0 -  PHQ-9 Score 0 -    Past Medical History:  Past Medical History:  Diagnosis Date  . Headache   . Ovarian cyst     Surgical History:  History reviewed. No pertinent surgical history.  Medications:  No current outpatient prescriptions on file prior to visit.   No current facility-administered medications on file prior to visit.     Allergies:  Allergies  Allergen Reactions  . Valtrex [Valacyclovir Hcl] Hives    Social History:  Social History   Social History  . Marital status: Single    Spouse name: N/A  . Number of children: N/A  . Years of education: N/A   Occupational History  . Not on file.   Social History Main Topics  . Smoking status: Never Smoker  . Smokeless tobacco: Never Used  . Alcohol use No  . Drug use: No  . Sexual activity: Not Currently   Other Topics Concern  . Not on file   Social History Narrative  . No narrative on file   History  Smoking Status  . Never Smoker  Smokeless Tobacco  . Never Used   History  Alcohol Use No     Family History:  Family History  Problem Relation Age of Onset  . Cancer Father        kidney  . Kidney disease Father   . Hypertension Father   . Hypertension Maternal Aunt   . Hypertension Paternal Aunt   . Stroke Maternal Grandmother   . Stroke Maternal Grandfather     Past medical history, surgical history, medications, allergies, family history and social history reviewed with patient today and changes made to appropriate areas of the chart.   Review of Systems  Constitutional: Negative.   HENT: Negative.   Eyes: Negative.   Respiratory: Negative.   Cardiovascular: Positive for palpitations. Negative for chest pain, orthopnea, claudication, leg swelling and PND.  Gastrointestinal: Negative.   Genitourinary: Negative.   Musculoskeletal: Negative.   Skin: Negative.   Neurological: Negative.   Endo/Heme/Allergies: Positive for environmental allergies. Negative for polydipsia. Does not bruise/bleed easily.  Psychiatric/Behavioral: Negative.     All other ROS negative except what is listed above and in the HPI.      Objective:    BP 126/86 (BP Location: Left Arm, Patient Position: Sitting, Cuff Size: Large)   Pulse 97   Temp 99.4 F (37.4 C)   Ht  5' 1.4" (1.56 m)   Wt 200 lb 9 oz (91 kg)   LMP 04/08/2017 (Exact Date)   SpO2 100%   BMI 37.40 kg/m   Wt Readings from Last 3 Encounters:  05/01/17 200 lb 9 oz (91 kg)  03/24/17 201 lb (91.2 kg)  10/23/16 192 lb (87.1 kg) (97 %, Z= 1.81)*   * Growth percentiles are based on CDC 2-20 Years data.    Physical Exam  Constitutional: She is oriented to person, place, and time. She appears well-developed and well-nourished. No distress.  HENT:  Head: Normocephalic and atraumatic.  Right Ear: Hearing and external ear normal.  Left Ear: Hearing and external ear normal.  Nose: Nose normal.  Mouth/Throat: Oropharynx is clear and moist. No oropharyngeal exudate.  Eyes: Pupils are equal, round, and reactive to light.  Conjunctivae, EOM and lids are normal. Right eye exhibits no discharge. Left eye exhibits no discharge. No scleral icterus.  Neck: Normal range of motion. Neck supple. No JVD present. No tracheal deviation present. No thyromegaly present.  Cardiovascular: Normal rate, regular rhythm, normal heart sounds and intact distal pulses.  Exam reveals no gallop and no friction rub.   No murmur heard. Pulmonary/Chest: Effort normal and breath sounds normal. No stridor. No respiratory distress. She has no wheezes. She has no rales. She exhibits no tenderness. Right breast exhibits no inverted nipple, no mass, no nipple discharge, no skin change and no tenderness. Left breast exhibits no inverted nipple, no mass, no nipple discharge, no skin change and no tenderness. Breasts are symmetrical.  Abdominal: Soft. Bowel sounds are normal. She exhibits no distension and no mass. There is no tenderness. There is no rebound and no guarding.  Genitourinary:  Genitourinary Comments: Pelvic exam deferred with shared decision making.   Musculoskeletal: Normal range of motion. She exhibits no edema, tenderness or deformity.  Lymphadenopathy:    She has no cervical adenopathy.  Neurological: She is alert and oriented to person, place, and time. She has normal reflexes. She displays normal reflexes. No cranial nerve deficit. She exhibits normal muscle tone. Coordination normal.  Skin: Skin is warm, dry and intact. No rash noted. She is not diaphoretic. No erythema. No pallor.  Psychiatric: She has a normal mood and affect. Her speech is normal and behavior is normal. Judgment and thought content normal. Cognition and memory are normal.  Nursing note and vitals reviewed.   Results for orders placed or performed in visit on 03/24/17  WET PREP FOR TRICH, YEAST, CLUE  Result Value Ref Range   Trichomonas Exam Negative Negative   Yeast Exam Negative Negative   Clue Cell Exam Positive (A) Negative      Assessment & Plan:    Problem List Items Addressed This Visit    None    Visit Diagnoses    Routine general medical examination at a health care facility    -  Primary   Vaccines up to date. Screening labs checked today. Continue diet and exercise. Call with any concerns.    Relevant Orders   CBC with Differential/Platelet   Comprehensive metabolic panel   Lipid Panel w/o Chol/HDL Ratio   TSH   UA/M w/rflx Culture, Routine   Family history of diabetes mellitus       Would like to see dietician. Referral generated today.    Relevant Orders   Referral to Nutrition and Diabetes Services   Family history of hypertension       Would like to see dietician. Referral  generated today.    Relevant Orders   Referral to Nutrition and Diabetes Services   Obesity (BMI 30-39.9)       Would like to see dietician. Referral generated today.    Relevant Orders   Referral to Nutrition and Diabetes Services       Follow up plan: Return in about 1 year (around 05/01/2018) for Physical.   LABORATORY TESTING:  - Pap smear: not applicable  IMMUNIZATIONS:   - Tdap: Tetanus vaccination status reviewed: last tetanus booster within 10 years. - Influenza: Postponed to flu season - Pneumovax: Not applicable  PATIENT COUNSELING:   Advised to take 1 mg of folate supplement per day if capable of pregnancy.   Sexuality: Discussed sexually transmitted diseases, partner selection, use of condoms, avoidance of unintended pregnancy  and contraceptive alternatives.   Advised to avoid cigarette smoking.  I discussed with the patient that most people either abstain from alcohol or drink within safe limits (<=14/week and <=4 drinks/occasion for males, <=7/weeks and <= 3 drinks/occasion for females) and that the risk for alcohol disorders and other health effects rises proportionally with the number of drinks per week and how often a drinker exceeds daily limits.  Discussed cessation/primary prevention of drug use and availability  of treatment for abuse.   Diet: Encouraged to adjust caloric intake to maintain  or achieve ideal body weight, to reduce intake of dietary saturated fat and total fat, to limit sodium intake by avoiding high sodium foods and not adding table salt, and to maintain adequate dietary potassium and calcium preferably from fresh fruits, vegetables, and low-fat dairy products.    stressed the importance of regular exercise  Injury prevention: Discussed safety belts, safety helmets, smoke detector, smoking near bedding or upholstery.   Dental health: Discussed importance of regular tooth brushing, flossing, and dental visits.    NEXT PREVENTATIVE PHYSICAL DUE IN 1 YEAR. Return in about 1 year (around 05/01/2018) for Physical.

## 2017-05-02 LAB — LIPID PANEL W/O CHOL/HDL RATIO
CHOLESTEROL TOTAL: 126 mg/dL (ref 100–199)
HDL: 49 mg/dL (ref >39)
LDL Calculated: 68 mg/dL (ref 0–99)
TRIGLYCERIDES: 45 mg/dL (ref 0–149)
VLDL Cholesterol Cal: 9 mg/dL (ref 5–40)

## 2017-05-02 LAB — COMPREHENSIVE METABOLIC PANEL
A/G RATIO: 1.6 (ref 1.2–2.2)
ALK PHOS: 84 IU/L (ref 39–117)
ALT: 18 IU/L (ref 0–32)
AST: 17 IU/L (ref 0–40)
Albumin: 4.2 g/dL (ref 3.5–5.5)
BILIRUBIN TOTAL: 0.6 mg/dL (ref 0.0–1.2)
BUN/Creatinine Ratio: 13 (ref 9–23)
BUN: 11 mg/dL (ref 6–20)
CO2: 23 mmol/L (ref 20–29)
Calcium: 9.5 mg/dL (ref 8.7–10.2)
Chloride: 101 mmol/L (ref 96–106)
Creatinine, Ser: 0.86 mg/dL (ref 0.57–1.00)
GFR calc Af Amer: 112 mL/min/{1.73_m2} (ref >59)
GFR calc non Af Amer: 98 mL/min/{1.73_m2} (ref >59)
GLOBULIN, TOTAL: 2.7 g/dL (ref 1.5–4.5)
Glucose: 90 mg/dL (ref 65–99)
POTASSIUM: 4.7 mmol/L (ref 3.5–5.2)
SODIUM: 138 mmol/L (ref 134–144)
Total Protein: 6.9 g/dL (ref 6.0–8.5)

## 2017-05-02 LAB — CBC WITH DIFFERENTIAL/PLATELET
BASOS: 0 %
Basophils Absolute: 0 10*3/uL (ref 0.0–0.2)
EOS (ABSOLUTE): 0.1 10*3/uL (ref 0.0–0.4)
EOS: 2 %
Hematocrit: 39.5 % (ref 34.0–46.6)
Hemoglobin: 13.3 g/dL (ref 11.1–15.9)
IMMATURE GRANS (ABS): 0 10*3/uL (ref 0.0–0.1)
IMMATURE GRANULOCYTES: 0 %
Lymphocytes Absolute: 1.4 10*3/uL (ref 0.7–3.1)
Lymphs: 28 %
MCH: 27.1 pg (ref 26.6–33.0)
MCHC: 33.7 g/dL (ref 31.5–35.7)
MCV: 81 fL (ref 79–97)
Monocytes Absolute: 0.5 10*3/uL (ref 0.1–0.9)
Monocytes: 9 %
NEUTROS PCT: 61 %
Neutrophils Absolute: 3 10*3/uL (ref 1.4–7.0)
PLATELETS: 278 10*3/uL (ref 150–379)
RBC: 4.9 x10E6/uL (ref 3.77–5.28)
RDW: 15.4 % (ref 12.3–15.4)
WBC: 5 10*3/uL (ref 3.4–10.8)

## 2017-05-02 LAB — TSH: TSH: 1.31 u[IU]/mL (ref 0.450–4.500)

## 2017-05-06 ENCOUNTER — Encounter: Payer: Self-pay | Admitting: Dietician

## 2017-05-06 ENCOUNTER — Encounter: Payer: Commercial Managed Care - PPO | Attending: Family Medicine | Admitting: Dietician

## 2017-05-06 VITALS — Ht 61.0 in | Wt 199.4 lb

## 2017-05-06 DIAGNOSIS — E669 Obesity, unspecified: Secondary | ICD-10-CM | POA: Insufficient documentation

## 2017-05-06 DIAGNOSIS — Z6837 Body mass index (BMI) 37.0-37.9, adult: Secondary | ICD-10-CM

## 2017-05-06 DIAGNOSIS — Z68.41 Body mass index (BMI) pediatric, greater than or equal to 95th percentile for age: Secondary | ICD-10-CM | POA: Diagnosis not present

## 2017-05-06 DIAGNOSIS — Z833 Family history of diabetes mellitus: Secondary | ICD-10-CM | POA: Insufficient documentation

## 2017-05-06 DIAGNOSIS — Z8249 Family history of ischemic heart disease and other diseases of the circulatory system: Secondary | ICD-10-CM | POA: Diagnosis not present

## 2017-05-06 DIAGNOSIS — E6609 Other obesity due to excess calories: Secondary | ICD-10-CM

## 2017-05-06 NOTE — Progress Notes (Signed)
Medical Nutrition Therapy: Visit start time: 1615  end time: 1715  Assessment:  Diagnosis: overweight, elevated BP  Past medical history: none significant. Psychosocial issues/ stress concerns: none Preferred learning method:  . Auditory . Visual . Hands-on  Current weight: 199.4lbs  Height: 5'1" Medications, supplements: reconciled list in medical record; patient is taking several vitamin supplements.  Progress and evaluation: Patient seeks help with general healthy eating due to family history of diabetes and hypertension. She is working on weight loss, has decreased sweets such as ice cream and doughnuts recently. States she loves chips, fried foods, but has begun eating healthier breakfast. Drinks diluted apple cider vinegar daily.   Physical activity: walking, other cardio exercise 30-90 minutes, 3-4 times a week  Dietary Intake:  Usual eating pattern includes 2-3 meals and 1-2 snacks per day. Dining out frequency: 2-4 meals per week.  Breakfast: sometimes fruit, oatmeal, cereal Snack: none Lunch: grilled cheese, sometimes fried foods, sometimes skips due to busy schedule -- had banana today Snack: fruit, granola bars, Naked vegetable drink or smoothie Supper: mom cooks pork chop, greens, okra, mac and cheese, mashed potatoes,  Snack: maybe cereal or fruit, loves potato chips, ice cream sandwich Beverages: water, juice fruit/veg blend, milk  Nutrition Care Education: Topics covered: basic nutrition, weight control, prevention of diabetes and hypertension Basic nutrition: basic food groups, appropriate nutrient balance, appropriate meal and snack schedule, general nutrition guidelines, vitamin supplementation including use of juice drink as significant vitamin source.  Weight control: benefits of weight control, limiting extra sugar and fat in foods for calorie control, portion control, options for balanced meals, basic meal planning using food models and plate method, role of  exercise in weight control and overall health. Diabetes prevention: appropriate meal and snack schedule, appropriate carb intake and balance Hypertension prevention: identifying high sodium foods, identifying food sources of potassium, magnesium  Nutritional Diagnosis:  [place]-3.3 Overweight/obesity As related to excess calories.  As evidenced by BMI of 37.7, patient report.  Intervention: Instruction as noted above.   Set goals with input from patient.   She declined scheduling follow-up at this time, but will schedule later if needed.  Education Materials given:  . Plate Planner with food lists . Food lists/ Planning A Balanced Meal . Sample meal pattern/ menus . Goals/ instructions  Learner/ who was taught:  . Patient   Level of understanding: Marland Kitchen Verbalizes/ demonstrates competency  Demonstrated degree of understanding via:   Teach back Learning barriers: . None  Willingness to learn/ readiness for change: . Eager, change in progress  Monitoring and Evaluation:  Dietary intake, exercise, and body weight      follow up: prn

## 2017-05-06 NOTE — Patient Instructions (Signed)
   Eat smaller portions of starchy foods and meats at dinner meals.   Plan to have something, either a meal or snack, every 4-5 hours during the day.   Eat larger portions of veggies, and have them more often.   Remember to count 1 Naked drink as 2 servings of fruits and 1-2 servings of vegetables.   Keep up your regular exercise, great job so far!  You can take less multivitamins when you have a Naked drink.

## 2017-05-08 ENCOUNTER — Encounter: Payer: Self-pay | Admitting: Family Medicine

## 2017-05-08 NOTE — Telephone Encounter (Signed)
Routing to provider  

## 2017-07-06 ENCOUNTER — Encounter: Payer: Self-pay | Admitting: Family Medicine

## 2017-07-06 ENCOUNTER — Ambulatory Visit: Payer: Commercial Managed Care - PPO | Admitting: Family Medicine

## 2017-07-06 VITALS — BP 141/83 | HR 78 | Temp 98.6°F | Wt 201.0 lb

## 2017-07-06 DIAGNOSIS — R350 Frequency of micturition: Secondary | ICD-10-CM | POA: Diagnosis not present

## 2017-07-06 LAB — UA/M W/RFLX CULTURE, ROUTINE
Bilirubin, UA: NEGATIVE
GLUCOSE, UA: NEGATIVE
Ketones, UA: NEGATIVE
LEUKOCYTES UA: NEGATIVE
NITRITE UA: NEGATIVE
PROTEIN UA: NEGATIVE
Specific Gravity, UA: 1.015 (ref 1.005–1.030)
Urobilinogen, Ur: 0.2 mg/dL (ref 0.2–1.0)
pH, UA: 7 (ref 5.0–7.5)

## 2017-07-06 LAB — MICROSCOPIC EXAMINATION
Bacteria, UA: NONE SEEN
RBC, UA: NONE SEEN /hpf (ref 0–?)
WBC UA: NONE SEEN /HPF (ref 0–?)

## 2017-07-06 NOTE — Patient Instructions (Addendum)

## 2017-07-06 NOTE — Progress Notes (Signed)
BP (!) 141/83 (BP Location: Left Arm, Patient Position: Sitting, Cuff Size: Large)   Pulse 78   Temp 98.6 F (37 C)   Wt 201 lb (91.2 kg)   SpO2 100%   BMI 37.98 kg/m    Subjective:    Patient ID: Beth Wagner, female    DOB: [birth year], 21 y.o.   MRN: [id number]  HPI: Beth FickDondra Glade is a 21 y.o. female  Chief Complaint  Patient presents with  . Urinary Frequency   URINARY SYMPTOMS- peeing a lot during her period, every hour Duration: past 2-3 days Dysuria: no Urinary frequency: yes Urgency: yes Small volume voids: no Symptom severity: moderate Urinary incontinence: no Foul odor: no Hematuria: no Abdominal pain: no Back pain: no Suprapubic pain/pressure: yes Flank pain: no Fever:  no Vomiting: no Relief with cranberry juice: no Relief with pyridium: no Status: stable Previous urinary tract infection: no Recurrent urinary tract infection: no Sexual activity: practicing safe sex History of sexually transmitted disease: no Vaginal discharge: no Treatments attempted: increasing fluids   Relevant past medical, surgical, family and social history reviewed and updated as indicated. Interim medical history since our last visit reviewed. Allergies and medications reviewed and updated.  Review of Systems  Constitutional: Negative.   Respiratory: Negative.   Cardiovascular: Negative.   Genitourinary: Positive for frequency and urgency. Negative for decreased urine volume, difficulty urinating, dyspareunia, dysuria, enuresis, flank pain, genital sores, hematuria, menstrual problem, pelvic pain, vaginal bleeding, vaginal discharge and vaginal pain.  Psychiatric/Behavioral: Negative.     Per HPI unless specifically indicated above     Objective:    BP (!) 141/83 (BP Location: Left Arm, Patient Position: Sitting, Cuff Size: Large)   Pulse 78   Temp 98.6 F (37 C)   Wt 201 lb (91.2 kg)   SpO2 100%   BMI 37.98 kg/m   Wt Readings from Last 3 Encounters:    07/06/17 201 lb (91.2 kg)  05/06/17 199 lb 6.4 oz (90.4 kg)  05/01/17 200 lb 9 oz (91 kg)    Physical Exam  Constitutional: She is oriented to person, place, and time. She appears well-developed and well-nourished. No distress.  HENT:  Head: Normocephalic and atraumatic.  Right Ear: Hearing normal.  Left Ear: Hearing normal.  Nose: Nose normal.  Eyes: Conjunctivae and lids are normal. Right eye exhibits no discharge. Left eye exhibits no discharge. No scleral icterus.  Cardiovascular: Normal rate, regular rhythm, normal heart sounds and intact distal pulses. Exam reveals no gallop and no friction rub.  No murmur heard. Pulmonary/Chest: Effort normal and breath sounds normal. No respiratory distress. She has no wheezes. She has no rales. She exhibits no tenderness.  Musculoskeletal: Normal range of motion.  Neurological: She is alert and oriented to person, place, and time.  Skin: Skin is warm, dry and intact. No rash noted. She is not diaphoretic. No erythema. No pallor.  Psychiatric: She has a normal mood and affect. Her speech is normal and behavior is normal. Judgment and thought content normal. Cognition and memory are normal.  Nursing note and vitals reviewed.   Results for orders placed or performed in visit on 05/01/17  CBC with Differential/Platelet  Result Value Ref Range   WBC 5.0 3.4 - 10.8 x10E3/uL   RBC 4.90 3.77 - 5.28 x10E6/uL   Hemoglobin 13.3 11.1 - 15.9 g/dL   Hematocrit 11.939.5 14.734.0 - 46.6 %   MCV 81 79 - 97 fL   MCH 27.1 26.6 - 33.0 pg  MCHC 33.7 31.5 - 35.7 g/dL   RDW 40.9 81.1 - 91.4 %   Platelets 278 150 - 379 x10E3/uL   Neutrophils 61 Not Estab. %   Lymphs 28 Not Estab. %   Monocytes 9 Not Estab. %   Eos 2 Not Estab. %   Basos 0 Not Estab. %   Neutrophils Absolute 3.0 1.4 - 7.0 x10E3/uL   Lymphocytes Absolute 1.4 0.7 - 3.1 x10E3/uL   Monocytes Absolute 0.5 0.1 - 0.9 x10E3/uL   EOS (ABSOLUTE) 0.1 0.0 - 0.4 x10E3/uL   Basophils Absolute 0.0 0.0 - 0.2  x10E3/uL   Immature Granulocytes 0 Not Estab. %   Immature Grans (Abs) 0.0 0.0 - 0.1 x10E3/uL  Comprehensive metabolic panel  Result Value Ref Range   Glucose 90 65 - 99 mg/dL   BUN 11 6 - 20 mg/dL   Creatinine, Ser 7.82 0.57 - 1.00 mg/dL   GFR calc non Af Amer 98 >59 mL/min/1.73   GFR calc Af Amer 112 >59 mL/min/1.73   BUN/Creatinine Ratio 13 9 - 23   Sodium 138 134 - 144 mmol/L   Potassium 4.7 3.5 - 5.2 mmol/L   Chloride 101 96 - 106 mmol/L   CO2 23 20 - 29 mmol/L   Calcium 9.5 8.7 - 10.2 mg/dL   Total Protein 6.9 6.0 - 8.5 g/dL   Albumin 4.2 3.5 - 5.5 g/dL   Globulin, Total 2.7 1.5 - 4.5 g/dL   Albumin/Globulin Ratio 1.6 1.2 - 2.2   Bilirubin Total 0.6 0.0 - 1.2 mg/dL   Alkaline Phosphatase 84 39 - 117 IU/L   AST 17 0 - 40 IU/L   ALT 18 0 - 32 IU/L  Lipid Panel w/o Chol/HDL Ratio  Result Value Ref Range   Cholesterol, Total 126 100 - 199 mg/dL   Triglycerides 45 0 - 149 mg/dL   HDL 49 >95 mg/dL   VLDL Cholesterol Cal 9 5 - 40 mg/dL   LDL Calculated 68 0 - 99 mg/dL  TSH  Result Value Ref Range   TSH 1.310 0.450 - 4.500 uIU/mL  UA/M w/rflx Culture, Routine  Result Value Ref Range   Specific Gravity, UA 1.015 1.005 - 1.030   pH, UA 6.5 5.0 - 7.5   Color, UA Yellow Yellow   Appearance Ur Clear Clear   Leukocytes, UA Negative Negative   Protein, UA Negative Negative/Trace   Glucose, UA Negative Negative   Ketones, UA Negative Negative   RBC, UA Negative Negative   Bilirubin, UA Negative Negative   Urobilinogen, Ur 0.2 0.2 - 1.0 mg/dL   Nitrite, UA Negative Negative      Assessment & Plan:   Problem List Items Addressed This Visit    None    Visit Diagnoses    Urinary frequency    -  Primary   UA clear. Will continue to monitor. If not better in 2-3 days, will recheck urine.    Relevant Orders   UA/M w/rflx Culture, Routine   UA/M w/rflx Culture, Routine       Follow up plan: Return if symptoms worsen or fail to improve.

## 2017-07-07 ENCOUNTER — Encounter: Payer: Self-pay | Admitting: Family Medicine

## 2017-07-08 ENCOUNTER — Other Ambulatory Visit: Payer: Commercial Managed Care - PPO

## 2017-07-08 DIAGNOSIS — R350 Frequency of micturition: Secondary | ICD-10-CM

## 2017-07-08 LAB — UA/M W/RFLX CULTURE, ROUTINE
Bilirubin, UA: NEGATIVE
Glucose, UA: NEGATIVE
KETONES UA: NEGATIVE
LEUKOCYTES UA: NEGATIVE
Nitrite, UA: NEGATIVE
PH UA: 5.5 (ref 5.0–7.5)
PROTEIN UA: NEGATIVE
SPEC GRAV UA: 1.025 (ref 1.005–1.030)
Urobilinogen, Ur: 0.2 mg/dL (ref 0.2–1.0)

## 2017-07-08 LAB — MICROSCOPIC EXAMINATION

## 2017-07-09 ENCOUNTER — Encounter: Payer: Self-pay | Admitting: Family Medicine

## 2017-07-09 ENCOUNTER — Other Ambulatory Visit: Payer: Self-pay | Admitting: Family Medicine

## 2017-07-09 MED ORDER — CIPROFLOXACIN HCL 500 MG PO TABS: 500.0000 mg | ORAL_TABLET | Freq: Two times a day (BID) | ORAL | 0 refills | Status: DC

## 2017-07-10 ENCOUNTER — Encounter: Payer: Self-pay | Admitting: Family Medicine

## 2017-07-11 ENCOUNTER — Encounter: Payer: Self-pay | Admitting: Family Medicine

## 2017-07-12 ENCOUNTER — Encounter: Payer: Self-pay | Admitting: Family Medicine

## 2017-07-14 ENCOUNTER — Telehealth: Payer: Self-pay | Admitting: General Surgery

## 2017-07-14 ENCOUNTER — Encounter: Payer: Self-pay | Admitting: Family Medicine

## 2017-07-14 ENCOUNTER — Encounter: Payer: Self-pay | Admitting: *Deleted

## 2017-07-14 ENCOUNTER — Ambulatory Visit: Payer: Commercial Managed Care - PPO | Admitting: Family Medicine

## 2017-07-14 VITALS — BP 123/80 | HR 93 | Temp 98.4°F | Wt 201.4 lb

## 2017-07-14 DIAGNOSIS — K429 Umbilical hernia without obstruction or gangrene: Secondary | ICD-10-CM | POA: Diagnosis not present

## 2017-07-14 NOTE — Patient Instructions (Signed)
Follow up as needed

## 2017-07-14 NOTE — Assessment & Plan Note (Signed)
Will refer to general surgery for further eval given worsening pain and increased frequency of sxs. Avoid ab workouts or strenuous activities until cleared. Warning signs reviewed at length including redness, severe unrelenting pain, fevers and she knows to go to the ER with these changes.

## 2017-07-14 NOTE — Progress Notes (Signed)
BP 123/80 (BP Location: Right Arm, Patient Position: Sitting, Cuff Size: Normal)   Pulse 93   Temp 98.4 F (36.9 C) (Oral)   Wt 201 lb 6.4 oz (91.4 kg)   SpO2 100%   BMI 38.05 kg/m    Subjective:    Patient ID: Beth Wagner, female    DOB: November 23, 1995, 21 y.o.   MRN: [id number]  HPI: Beth Wagner is a 21 y.o. female  Chief Complaint  Patient presents with  . Bloated    Pt stated after she got finished taking the antibiotic her stomach hurts, she felt bloating and heaviness. Explains dull pain where her belly button is. Denies Vomitting, nausea, diarrhea.    Patient presenting today with bloating and umbilical abdominal pain x 1-2 weeks. Worse with bending and any sort of abdominal movements, and states she can feel a bulge in the area. Pain is dull and aching. Has been doing a lot of ab workouts at the gym and feels like that agitates the area. Knows she has a small umbilical hernia, found on CT about a year ago. Has not been trying anything OTC for sxs.   Relevant past medical, surgical, family and social history reviewed and updated as indicated. Interim medical history since our last visit reviewed. Allergies and medications reviewed and updated.  Review of Systems  Constitutional: Negative.   HENT: Negative.   Eyes: Negative.   Respiratory: Negative.   Cardiovascular: Negative.   Gastrointestinal: Positive for abdominal pain.  Genitourinary: Negative.   Musculoskeletal: Negative.   Neurological: Negative.   Psychiatric/Behavioral: Negative.    Per HPI unless specifically indicated above     Objective:    BP 123/80 (BP Location: Right Arm, Patient Position: Sitting, Cuff Size: Normal)   Pulse 93   Temp 98.4 F (36.9 C) (Oral)   Wt 201 lb 6.4 oz (91.4 kg)   SpO2 100%   BMI 38.05 kg/m   Wt Readings from Last 3 Encounters:  07/14/17 201 lb 6.4 oz (91.4 kg)  07/06/17 201 lb (91.2 kg)  05/06/17 199 lb 6.4 oz (90.4 kg)    Physical Exam  Constitutional: She is  oriented to person, place, and time. She appears well-developed and well-nourished. No distress.  HENT:  Head: Atraumatic.  Eyes: Conjunctivae are normal. Pupils are equal, round, and reactive to light.  Neck: Normal range of motion. Neck supple.  Cardiovascular: Normal rate and normal heart sounds.  Pulmonary/Chest: Effort normal and breath sounds normal. No respiratory distress.  Abdominal: Soft. Bowel sounds are normal. She exhibits no distension. There is tenderness (Significant ttp right umbilicus, possible hernia bulge palpated). A hernia (umbilical) is present.  Musculoskeletal: Normal range of motion.  Neurological: She is alert and oriented to person, place, and time.  Skin: Skin is warm and dry. No erythema (no erythema in area of pain).  Psychiatric: She has a normal mood and affect. Her behavior is normal.  Nursing note and vitals reviewed.     Assessment & Plan:   Problem List Items Addressed This Visit      Other   Umbilical hernia - Primary    Will refer to general surgery for further eval given worsening pain and increased frequency of sxs. Avoid ab workouts or strenuous activities until cleared. Warning signs reviewed at length including redness, severe unrelenting pain, fevers and she knows to go to the ER with these changes.       Relevant Orders   Ambulatory referral to General Surgery  Follow up plan: Return if symptoms worsen or fail to improve.

## 2017-07-14 NOTE — Telephone Encounter (Signed)
I have left a message for the patient to call the office & schedule an appointment with Dr Lemar LivingsBYRNETT for umbilical hernia ref'd by Particia Nearingachel Elizabeth Lane PA-C(Dr Crissman) new to ofc .Has [employer] ins.

## 2017-07-22 ENCOUNTER — Encounter: Payer: Self-pay | Admitting: Family Medicine

## 2017-07-22 NOTE — Telephone Encounter (Signed)
Called pt. And scheduled lab appt for urin specimen 07/24/2017 at 11:45 am.

## 2017-07-22 NOTE — Telephone Encounter (Signed)
Copied from CRM [phone]. Topic: General - Other >> Jul 22, 2017 12:06 PM Clack, Princella PellegriniJessica D wrote: Reason for CRM:  Pt states Dr. Jeananne RamaJohnson adv her that she can just come into the lab to give am urine sample without an Ov, if this is so please call the pt back and adv her. She also states if she needs an appt for the office to call her to make an appt on Friday to come in and give the sample

## 2017-07-24 ENCOUNTER — Other Ambulatory Visit: Payer: Commercial Managed Care - PPO

## 2017-07-27 ENCOUNTER — Other Ambulatory Visit: Payer: Commercial Managed Care - PPO

## 2017-07-27 ENCOUNTER — Ambulatory Visit: Payer: Self-pay | Admitting: General Surgery

## 2017-07-27 ENCOUNTER — Encounter: Payer: Self-pay | Admitting: Family Medicine

## 2017-07-27 DIAGNOSIS — R300 Dysuria: Secondary | ICD-10-CM

## 2017-07-27 LAB — UA/M W/RFLX CULTURE, ROUTINE
BILIRUBIN UA: NEGATIVE
GLUCOSE, UA: NEGATIVE
Ketones, UA: NEGATIVE
Leukocytes, UA: NEGATIVE
Nitrite, UA: NEGATIVE
PROTEIN UA: NEGATIVE
RBC, UA: NEGATIVE
Specific Gravity, UA: 1.025 (ref 1.005–1.030)
Urobilinogen, Ur: 0.2 mg/dL (ref 0.2–1.0)
pH, UA: 5.5 (ref 5.0–7.5)

## 2017-07-30 NOTE — Telephone Encounter (Signed)
Pt. Came in for labs 07/27/2017.

## 2017-08-05 ENCOUNTER — Encounter: Payer: Self-pay | Admitting: Family Medicine

## 2017-08-10 ENCOUNTER — Ambulatory Visit: Payer: Self-pay | Admitting: Family Medicine

## 2017-08-13 ENCOUNTER — Ambulatory Visit (INDEPENDENT_AMBULATORY_CARE_PROVIDER_SITE_OTHER): Payer: Commercial Managed Care - PPO | Admitting: Family Medicine

## 2017-08-13 ENCOUNTER — Encounter: Payer: Self-pay | Admitting: Family Medicine

## 2017-08-13 VITALS — BP 132/73 | HR 82 | Temp 98.4°F | Wt 202.0 lb

## 2017-08-13 DIAGNOSIS — N3281 Overactive bladder: Secondary | ICD-10-CM | POA: Diagnosis not present

## 2017-08-13 DIAGNOSIS — R3915 Urgency of urination: Secondary | ICD-10-CM | POA: Diagnosis not present

## 2017-08-13 LAB — UA/M W/RFLX CULTURE, ROUTINE
BILIRUBIN UA: NEGATIVE
Glucose, UA: NEGATIVE
Leukocytes, UA: NEGATIVE
NITRITE UA: NEGATIVE
Protein, UA: NEGATIVE
SPEC GRAV UA: 1.025 (ref 1.005–1.030)
Urobilinogen, Ur: 0.2 mg/dL (ref 0.2–1.0)
pH, UA: 5 (ref 5.0–7.5)

## 2017-08-13 LAB — MICROSCOPIC EXAMINATION

## 2017-08-13 MED ORDER — TOLTERODINE TARTRATE ER 4 MG PO CP24: 4.0000 mg | ORAL_CAPSULE | Freq: Every day | ORAL | 3 refills | Status: DC

## 2017-08-13 NOTE — Progress Notes (Signed)
BP 132/73 (BP Location: Left Arm, Patient Position: Sitting, Cuff Size: Large)   Pulse 82   Temp 98.4 F (36.9 C)   Wt 202 lb (91.6 kg)   SpO2 100%   BMI 38.17 kg/m    Subjective:    Patient ID: Beth Wagner, female    DOB: 1996/02/16, 21 y.o.   MRN: [id number]  HPI: Beth Wagner is a 21 y.o. female  Chief Complaint  Patient presents with  . Urinary Frequency   URINARY SYMPTOMS Duration: About a month Dysuria: yes Urinary frequency: yes- getting up 5x a night to go to the bathroom Urgency: yes Small volume voids: no Symptom severity: moderate Urinary incontinence: no Foul odor: no Hematuria: no Abdominal pain: no Back pain: no Suprapubic pain/pressure: yes Flank pain: no Fever:  no Vomiting: no Relief with cranberry juice: no Relief with pyridium: no Status: stable Previous urinary tract infection: yes Recurrent urinary tract infection: no Sexual activity: practicing safe sex History of sexually transmitted disease: no Vaginal discharge: no Treatments attempted: antibiotics, cranberry and increasing fluids   Relevant past medical, surgical, family and social history reviewed and updated as indicated. Interim medical history since our last visit reviewed. Allergies and medications reviewed and updated.  Review of Systems  Constitutional: Negative.   Respiratory: Negative.   Cardiovascular: Negative.   Genitourinary: Positive for frequency and urgency. Negative for decreased urine volume, difficulty urinating, dyspareunia, dysuria, enuresis, flank pain, genital sores, hematuria, menstrual problem, pelvic pain, vaginal bleeding, vaginal discharge and vaginal pain.  Musculoskeletal: Negative.   Psychiatric/Behavioral: Negative.     Per HPI unless specifically indicated above     Objective:    BP 132/73 (BP Location: Left Arm, Patient Position: Sitting, Cuff Size: Large)   Pulse 82   Temp 98.4 F (36.9 C)   Wt 202 lb (91.6 kg)   SpO2 100%   BMI  38.17 kg/m   Wt Readings from Last 3 Encounters:  08/13/17 202 lb (91.6 kg)  07/14/17 201 lb 6.4 oz (91.4 kg)  07/06/17 201 lb (91.2 kg)    Physical Exam  Constitutional: She is oriented to person, place, and time. She appears well-developed and well-nourished. No distress.  HENT:  Head: Normocephalic and atraumatic.  Right Ear: Hearing normal.  Left Ear: Hearing normal.  Nose: Nose normal.  Eyes: Conjunctivae and lids are normal. Right eye exhibits no discharge. Left eye exhibits no discharge. No scleral icterus.  Cardiovascular: Normal rate, regular rhythm, normal heart sounds and intact distal pulses. Exam reveals no gallop and no friction rub.  No murmur heard. Pulmonary/Chest: Effort normal and breath sounds normal. No respiratory distress. She has no wheezes. She has no rales. She exhibits no tenderness.  Musculoskeletal: Normal range of motion.  Neurological: She is alert and oriented to person, place, and time.  Skin: Skin is intact. No rash noted. She is not diaphoretic.  Psychiatric: She has a normal mood and affect. Her speech is normal and behavior is normal. Judgment and thought content normal. Cognition and memory are normal.  Nursing note and vitals reviewed.   Results for orders placed or performed in visit on 07/27/17  UA/M w/rflx Culture, Routine  Result Value Ref Range   Specific Gravity, UA 1.025 1.005 - 1.030   pH, UA 5.5 5.0 - 7.5   Color, UA Yellow Yellow   Appearance Ur Cloudy (A) Clear   Leukocytes, UA Negative Negative   Protein, UA Negative Negative/Trace   Glucose, UA Negative Negative   Ketones, UA  Negative Negative   RBC, UA Negative Negative   Bilirubin, UA Negative Negative   Urobilinogen, Ur 0.2 0.2 - 1.0 mg/dL   Nitrite, UA Negative Negative      Assessment & Plan:   Problem List Items Addressed This Visit    None    Visit Diagnoses    Overactive bladder    -  Primary   Will treat with detrol LA and recheck 1 month. Call with any  concerns.    Urinary urgency       Negative UA today.   Relevant Orders   UA/M w/rflx Culture, Routine       Follow up plan: Return in about 4 weeks (around 09/10/2017) for Follow up over active bladder.

## 2017-08-13 NOTE — Patient Instructions (Addendum)
Tolterodine extended-release capsules What is this medicine? TOLTERODINE (tole TER a deen) is used to treat overactive bladder. This medicine reduces the amount of bathroom visits. It may also help to control wetting accidents. This medicine may be used for other purposes; ask your health care provider or pharmacist if you have questions. COMMON BRAND NAME(S): Detrol LA What should I tell my health care provider before I take this medicine? They need to know if you have any of these conditions: -difficulty passing urine -glaucoma -intestinal obstruction -irregular heartbeat or you have a family member with irregular heartbeat -kidney disease -liver disease -myasthenia gravis -an unusual or allergic reaction to tolterodine, fesoterodine, other medicines, foods, dyes, or preservatives -pregnant or trying to get pregnant -breast-feeding How should I use this medicine? Take this medicine by mouth with a glass of water. Swallow whole, do not crush, cut, or chew. Follow the directions on the prescription label. Take your doses at regular intervals. Do not take your medicine more often than directed. Talk to your pediatrician regarding the use of this medicine in children. Special care may be needed. Overdosage: If you think you have taken too much of this medicine contact a poison control center or emergency room at once. NOTE: This medicine is only for you. Do not share this medicine with others. What if I miss a dose? If you miss a dose, take it as soon as you can. If it is almost time for your next dose, take only that dose. Do not take double or extra doses. What may interact with this medicine? -clarithromycin -cyclosporine -erythromycin -fluoxetine -medicines for fungal infections, like fluconazole, itraconazole, ketoconazole or voriconazole -medicines for memory problems like galantamine, donepezil, tacrine -vinblastine This list may not describe all possible interactions. Give your  health care provider a list of all the medicines, herbs, non-prescription drugs, or dietary supplements you use. Also tell them if you smoke, drink alcohol, or use illegal drugs. Some items may interact with your medicine. What should I watch for while using this medicine? It may take 2 or 3 months to notice the full benefit from this medicine. Your health care professional may also recommend techniques that may help improve control of your bladder and sphincter muscles. These techniques will help you need the bathroom less frequently. You may need to limit your intake tea, coffee, caffeinated sodas, and alcohol. These drinks may make your symptoms worse. Keeping healthy bowel habits may lessen bladder symptoms. If you currently smoke, quitting smoking may help reduce irritation to the bladder muscle. You may get drowsy or dizzy. Do not drive, use machinery, or do anything that needs mental alertness until you know how this drug affects you. Do not stand or sit up quickly, especially if you are an older patient. This reduces the risk of dizzy or fainting spells. Your mouth may get dry. Chewing sugarless gum or sucking hard candy, and drinking plenty of water, will help. This medicine may cause dry eyes and blurred vision. If you wear contact lenses you may feel some discomfort. Lubricating drops may help. See your eye doctor if the problem does not go away or is severe. What side effects may I notice from receiving this medicine? Side effects that you should report to your doctor or health care professional as soon as possible: -allergic reactions like skin rash, itching or hives, swelling of the face, lips, or tongue -breathing problems -confusion -difficulty passing urine -fast, irregular heartbeat -hallucinations -memory problems -swelling in feet, hands Side effects that  usually do not require medical attention (report to your doctor or health care professional if they continue or are  bothersome): -changes in vision -constipation -dry eyes, mouth -headache -dizziness, drowsiness -stomach upset This list may not describe all possible side effects. Call your doctor for medical advice about side effects. You may report side effects to FDA at 1-800-FDA-1088. Where should I keep my medicine? Keep out of the reach of children. Store at room temperature between 15 and 30 degrees C (59 and 86 degrees F). Protect from light. Throw away any unused medicine after the expiration date. NOTE: This sheet is a summary. It may not cover all possible information. If you have questions about this medicine, talk to your doctor, pharmacist, or health care provider.  2018 Elsevier/Gold Standard (2010-05-28 17:20:26)  Overactive Bladder, Adult Overactive bladder is a group of urinary symptoms. With overactive bladder, you may suddenly feel the need to pass urine (urinate) right away. After feeling this sudden urge, you might also leak urine if you cannot get to the bathroom fast enough (urinary incontinence). These symptoms might interfere with your daily work or social activities. Overactive bladder symptoms may also wake you up at night. Overactive bladder affects the nerve signals between your bladder and your brain. Your bladder may get the signal to empty before it is full. Very sensitive muscles can also make your bladder squeeze too soon. What are the causes? Many things can cause an overactive bladder. Possible causes include:  Urinary tract infection.  Infection of nearby tissues, such as the prostate.  Prostate enlargement.  Being pregnant with twins or more (multiples).  Surgery on the uterus or urethra.  Bladder stones, inflammation, or tumors.  Drinking too much caffeine or alcohol.  Certain medicines, especially those that you take to help your body get rid of extra fluid (diuretics) by increasing urine production.  Muscle or nerve weakness, especially from: ? A  spinal cord injury. ? Stroke. ? Multiple sclerosis. ? Parkinson disease.  Diabetes. This can cause a high urine volume that fills the bladder so quickly that the normal urge to urinate is triggered very strongly.  Constipation. A buildup of too much stool can put pressure on your bladder.  What increases the risk? You may be at greater risk for overactive bladder if you:  Are an older adult.  Smoke.  Are going through menopause.  Have prostate problems.  Have a neurological disease, such as stroke, dementia, Parkinson disease, or multiple sclerosis (MS).  Eat or drink things that irritate the bladder. These include alcohol, spicy food, and caffeine.  Are overweight or obese.  What are the signs or symptoms? The signs and symptoms of an overactive bladder include:  Sudden, strong urges to urinate.  Leaking urine.  Urinating eight or more times per day.  Waking up to urinate two or more times per night.  How is this diagnosed? Your health care provider may suspect overactive bladder based on your symptoms. The health care provider will do a physical exam and take your medical history. Blood or urine tests may also be done. For example, you might need to have a bladder function test to check how well you can hold your urine. You might also need to see a health care provider who specializes in the urinary tract (urologist). How is this treated? Treatment for overactive bladder depends on the cause of your condition and whether it is mild or severe. Certain treatments can be done in your health care provider's office  or clinic. You can also make lifestyle changes at home. Options include: Behavioral Treatments  Biofeedback. A specialist uses sensors to help you become aware of your body's signals.  Keeping a daily log of when you need to urinate and what happens after the urge. This may help you manage your condition.  Bladder training. This helps you learn to control the  urge to urinate by following a schedule that directs you to urinate at regular intervals (timed voiding). At first, you might have to wait a few minutes after feeling the urge. In time, you should be able to schedule bathroom visits an hour or more apart.  Kegel exercises. These are exercises to strengthen the pelvic floor muscles, which support the bladder. Toning these muscles can help you control urination, even if your bladder muscles are overactive. A specialist will teach you how to do these exercises correctly. They require daily practice.  Weight loss. If you are obese or overweight, losing weight might relieve your symptoms of overactive bladder. Talk to your health care provider about losing weight and whether there is a specific program or method that would work best for you.  Diet change. This might help if constipation is making your overactive bladder worse. Your health care provider or a dietitian can explain ways to change what you eat to ease constipation. You might also need to consume less alcohol and caffeine or drink other fluids at different times of the day.  Stopping smoking.  Wearing pads to absorb leakage while you wait for other treatments to take effect. Physical Treatments  Electrical stimulation. Electrodes send gentle pulses of electricity to strengthen the nerves or muscles that help to control the bladder. Sometimes, the electrodes are placed outside of the body. In other cases, they might be placed inside the body (implanted). This treatment can take several months to have an effect.  Supportive devices. Women may need a plastic device that fits into the vagina and supports the bladder (pessary). Medicines Several medicines can help treat overactive bladder and are usually used along with other treatments. Some are injected into the muscles involved in urination. Others come in pill form. Your health care provider may prescribe:  Antispasmodics. These medicines  block the signals that the nerves send to the bladder. This keeps the bladder from releasing urine at the wrong time.  Tricyclic antidepressants. These types of antidepressants also relax bladder muscles.  Surgery  You may have a device implanted to help manage the nerve signals that indicate when you need to urinate.  You may have surgery to implant electrodes for electrical stimulation.  Sometimes, very severe cases of overactive bladder require surgery to change the shape of the bladder. Follow these instructions at home:  Take medicines only as directed by your health care provider.  Use any implants or a pessary as directed by your health care provider.  Make any diet or lifestyle changes that are recommended by your health care provider. These might include: ? Drinking less fluid or drinking at different times of the day. If you need to urinate often during the night, you may need to stop drinking fluids early in the evening. ? Cutting down on caffeine or alcohol. Both can make an overactive bladder worse. Caffeine is found in coffee, tea, and sodas. ? Doing Kegel exercises to strengthen muscles. ? Losing weight if you need to. ? Eating a healthy and balanced diet to prevent constipation.  Keep a journal or log to track how much and  when you drink and also when you feel the need to urinate. This will help your health care provider to monitor your condition. Contact a health care provider if:  Your symptoms do not get better after treatment.  Your pain and discomfort are getting worse.  You have more frequent urges to urinate.  You have a fever. Get help right away if: You are not able to control your bladder at all. This information is not intended to replace advice given to you by your health care provider. Make sure you discuss any questions you have with your health care provider. Document Released: 06/14/2009 Document Revised: 01/24/2016 Document Reviewed:  01/11/2014 Elsevier Interactive Patient Education  [employer].

## 2017-08-21 ENCOUNTER — Encounter: Payer: Self-pay | Admitting: General Surgery

## 2017-08-21 ENCOUNTER — Encounter: Payer: Self-pay | Admitting: Family Medicine

## 2017-09-03 ENCOUNTER — Ambulatory Visit: Payer: Self-pay | Admitting: General Surgery

## 2017-09-18 ENCOUNTER — Encounter: Payer: Self-pay | Admitting: Family Medicine

## 2017-09-29 ENCOUNTER — Ambulatory Visit: Payer: Commercial Managed Care - PPO | Admitting: Family Medicine

## 2017-10-06 ENCOUNTER — Encounter: Payer: Self-pay | Admitting: Family Medicine

## 2017-10-07 ENCOUNTER — Encounter: Payer: Self-pay | Admitting: General Surgery

## 2017-10-07 ENCOUNTER — Other Ambulatory Visit: Payer: Self-pay | Admitting: Family Medicine

## 2017-10-07 DIAGNOSIS — K429 Umbilical hernia without obstruction or gangrene: Secondary | ICD-10-CM

## 2017-10-26 ENCOUNTER — Telehealth: Payer: Self-pay | Admitting: *Deleted

## 2017-10-26 ENCOUNTER — Encounter: Payer: Self-pay | Admitting: *Deleted

## 2017-10-26 NOTE — Telephone Encounter (Signed)
Left message for patient to call the office back to reschedule her appointment. She left a message with our answering service to reschedule her appointment for 10/27/17.

## 2017-10-27 ENCOUNTER — Ambulatory Visit: Payer: Self-pay | Admitting: General Surgery

## 2017-11-10 ENCOUNTER — Encounter: Payer: Self-pay | Admitting: *Deleted

## 2017-11-13 ENCOUNTER — Ambulatory Visit: Payer: Commercial Managed Care - PPO | Admitting: Podiatry

## 2017-12-15 ENCOUNTER — Encounter: Payer: Self-pay | Admitting: Family Medicine

## 2017-12-15 NOTE — Telephone Encounter (Signed)
Scheduled appt with Beth Wagner on 12/17/17

## 2017-12-17 ENCOUNTER — Ambulatory Visit: Payer: Commercial Managed Care - PPO | Admitting: Family Medicine

## 2017-12-17 ENCOUNTER — Encounter: Payer: Self-pay | Admitting: Family Medicine

## 2017-12-17 VITALS — BP 135/87 | HR 86 | Ht 61.0 in | Wt 202.0 lb

## 2017-12-17 DIAGNOSIS — N760 Acute vaginitis: Secondary | ICD-10-CM

## 2017-12-17 DIAGNOSIS — R300 Dysuria: Secondary | ICD-10-CM | POA: Diagnosis not present

## 2017-12-17 DIAGNOSIS — B9689 Other specified bacterial agents as the cause of diseases classified elsewhere: Secondary | ICD-10-CM | POA: Diagnosis not present

## 2017-12-17 LAB — UA/M W/RFLX CULTURE, ROUTINE
BILIRUBIN UA: NEGATIVE
Glucose, UA: NEGATIVE
Ketones, UA: NEGATIVE
LEUKOCYTES UA: NEGATIVE
NITRITE UA: NEGATIVE
Protein, UA: NEGATIVE
RBC UA: NEGATIVE
SPEC GRAV UA: 1.025 (ref 1.005–1.030)
Urobilinogen, Ur: 0.2 mg/dL (ref 0.2–1.0)
pH, UA: 5.5 (ref 5.0–7.5)

## 2017-12-17 LAB — WET PREP FOR TRICH, YEAST, CLUE
CLUE CELL EXAM: POSITIVE — AB
TRICHOMONAS EXAM: NEGATIVE
Yeast Exam: NEGATIVE

## 2017-12-17 MED ORDER — METRONIDAZOLE 500 MG PO TABS: 500.0000 mg | ORAL_TABLET | Freq: Two times a day (BID) | ORAL | 0 refills | Status: DC

## 2017-12-17 NOTE — Progress Notes (Signed)
BP 135/87   Pulse 86   Ht 5\' 1"  (1.549 m)   Wt 202 lb (91.6 kg)   SpO2 98%   BMI 38.17 kg/m    Subjective:    Patient ID: Beth Wagner, female    DOB: 12-Mar-1996, 22 y.o.   MRN: [id number]  HPI: Beth FickDondra Miklas is a 22 y.o. female  Chief Complaint  Patient presents with  . Urinary Tract Infection   Frequency and dysuria x 1 week, started getting better about 2 days ago when she started drinking lots of water rather than mostly sweet tea like she had been. Denies hematuria, N/V, fevers, discharge, vaginal odor, abdominal pain. Has not tried anything OTC other than switching to plain water and cranberry juice only for beverages. No concern for STIs.   Relevant past medical, surgical, family and social history reviewed and updated as indicated. Interim medical history since our last visit reviewed. Allergies and medications reviewed and updated.  Review of Systems  Per HPI unless specifically indicated above     Objective:    BP 135/87   Pulse 86   Ht 5\' 1"  (1.549 m)   Wt 202 lb (91.6 kg)   SpO2 98%   BMI 38.17 kg/m   Wt Readings from Last 3 Encounters:  12/17/17 202 lb (91.6 kg)  08/13/17 202 lb (91.6 kg)  07/14/17 201 lb 6.4 oz (91.4 kg)    Physical Exam  Constitutional: She is oriented to person, place, and time. She appears well-developed and well-nourished.  HENT:  Head: Atraumatic.  Eyes: Pupils are equal, round, and reactive to light. Conjunctivae and EOM are normal.  Neck: Normal range of motion.  Cardiovascular: Normal rate and regular rhythm.  Pulmonary/Chest: Effort normal and breath sounds normal.  Abdominal: Soft. Bowel sounds are normal. She exhibits no distension.  Genitourinary: Vaginal discharge (thick white discharge) found.  Musculoskeletal: Normal range of motion. She exhibits no tenderness (No CVA tenderness b/l).  Neurological: She is alert and oriented to person, place, and time.  Skin: Skin is warm and dry.  Psychiatric: She has a normal  mood and affect. Her behavior is normal.  Nursing note and vitals reviewed.  Results for orders placed or performed in visit on 12/17/17  WET PREP FOR TRICH, YEAST, CLUE  Result Value Ref Range   Trichomonas Exam Negative Negative   Yeast Exam Negative Negative   Clue Cell Exam Positive (A) Negative  UA/M w/rflx Culture, Routine  Result Value Ref Range   Specific Gravity, UA 1.025 1.005 - 1.030   pH, UA 5.5 5.0 - 7.5   Color, UA Yellow Yellow   Appearance Ur Cloudy (A) Clear   Leukocytes, UA Negative Negative   Protein, UA Negative Negative/Trace   Glucose, UA Negative Negative   Ketones, UA Negative Negative   RBC, UA Negative Negative   Bilirubin, UA Negative Negative   Urobilinogen, Ur 0.2 0.2 - 1.0 mg/dL   Nitrite, UA Negative Negative      Assessment & Plan:   Problem List Items Addressed This Visit    None    Visit Diagnoses    BV (bacterial vaginosis)    -  Primary   U/A WNL, wet prep + for clue cells. Flagyl sent, increase water intake, continue unscented soap, cotton panties, probiotics. F/u if sxs not improving   Relevant Medications   metroNIDAZOLE (FLAGYL) 500 MG tablet   Other Relevant Orders   UA/M w/rflx Culture, Routine (Completed)   WET PREP FOR TRICH, YEAST,  CLUE (Completed)   Dysuria       U/A benign, suspect irritation from BV causing sxs. Will tx with flagyl and monitor for improvement       Follow up plan: Return if symptoms worsen or fail to improve.

## 2017-12-20 NOTE — Patient Instructions (Signed)
Follow up as needed

## 2018-01-20 ENCOUNTER — Encounter: Payer: Self-pay | Admitting: Family Medicine

## 2018-01-27 ENCOUNTER — Encounter: Payer: Self-pay | Admitting: Family Medicine

## 2018-02-09 ENCOUNTER — Encounter: Payer: Self-pay | Admitting: Family Medicine

## 2018-03-06 ENCOUNTER — Encounter: Payer: Self-pay | Admitting: Family Medicine

## 2018-03-08 ENCOUNTER — Ambulatory Visit: Payer: Commercial Managed Care - PPO | Admitting: Family Medicine

## 2018-03-08 ENCOUNTER — Encounter: Payer: Self-pay | Admitting: Family Medicine

## 2018-03-08 VITALS — BP 137/82 | HR 99 | Temp 98.3°F | Ht 60.0 in | Wt 208.2 lb

## 2018-03-08 DIAGNOSIS — Z23 Encounter for immunization: Secondary | ICD-10-CM | POA: Diagnosis not present

## 2018-03-08 DIAGNOSIS — N907 Vulvar cyst: Secondary | ICD-10-CM | POA: Diagnosis not present

## 2018-03-08 DIAGNOSIS — N898 Other specified noninflammatory disorders of vagina: Secondary | ICD-10-CM

## 2018-03-08 LAB — WET PREP FOR TRICH, YEAST, CLUE
Clue Cell Exam: NEGATIVE
TRICHOMONAS EXAM: NEGATIVE
YEAST EXAM: NEGATIVE

## 2018-03-08 NOTE — Patient Instructions (Addendum)
Td Vaccine (Tetanus and Diphtheria): What You Need to Know 1. Why get vaccinated? Tetanus  and diphtheria are very serious diseases. They are rare in the [place] today, but people who do become infected often have severe complications. Td vaccine is used to protect adolescents and adults from both of these diseases. Both tetanus and diphtheria are infections caused by bacteria. Diphtheria spreads from person to person through coughing or sneezing. Tetanus-causing bacteria enter the body through cuts, scratches, or wounds. TETANUS (lockjaw) causes painful muscle tightening and stiffness, usually all over the body.  It can lead to tightening of muscles in the head and neck so you can't open your mouth, swallow, or sometimes even breathe. Tetanus kills about 1 out of every 10 people who are infected even after receiving the best medical care.  DIPHTHERIA can cause a thick coating to form in the back of the throat.  It can lead to breathing problems, paralysis, heart failure, and death.  Before vaccines, as many as 200,000 cases of diphtheria and hundreds of cases of tetanus were reported in the [place] each year. Since vaccination began, reports of cases for both diseases have dropped by about 99%. 2. Td vaccine Td vaccine can protect adolescents and adults from tetanus and diphtheria. Td is usually given as a booster dose every 10 years but it can also be given earlier after a severe and dirty wound or burn. Another vaccine, called Tdap, which protects against pertussis in addition to tetanus and diphtheria, is sometimes recommended instead of Td vaccine. Your doctor or the person giving you the vaccine can give you more information. Td may safely be given at the same time as other vaccines. 3. Some people should not get this vaccine  A person who has ever had a life-threatening allergic reaction after a previous dose of any tetanus or diphtheria containing vaccine, OR has a severe  allergy to any part of this vaccine, should not get Td vaccine. Tell the person giving the vaccine about any severe allergies.  Talk to your doctor if you: ? had severe pain or swelling after any vaccine containing diphtheria or tetanus, ? ever had a condition called Guillain Barre Syndrome (GBS), ? aren't feeling well on the day the shot is scheduled. 4. What are the risks from Td vaccine? With any medicine, including vaccines, there is a chance of side effects. These are usually mild and go away on their own. Serious reactions are also possible but are rare. Most people who get Td vaccine do not have any problems with it. Mild problems following Td vaccine: (Did not interfere with activities)  Pain where the shot was given (about 8 people in 10)  Redness or swelling where the shot was given (about 1 person in 4)  Mild fever (rare)  Headache (about 1 person in 4)  Tiredness (about 1 person in 4)  Moderate problems following Td vaccine: (Interfered with activities, but did not require medical attention)  Fever over 102F (rare)  Severe problems following Td vaccine: (Unable to perform usual activities; required medical attention)  Swelling, severe pain, bleeding and/or redness in the arm where the shot was given (rare).  Problems that could happen after any vaccine:  People sometimes faint after a medical procedure, including vaccination. Sitting or lying down for about 15 minutes can help prevent fainting, and injuries caused by a fall. Tell your doctor if you feel dizzy, or have vision changes or ringing in the ears.  Some people get   severe pain in the shoulder and have difficulty moving the arm where a shot was given. This happens very rarely.  Any medication can cause a severe allergic reaction. Such reactions from a vaccine are very rare, estimated at fewer than 1 in a million doses, and would happen within a few minutes to a few hours after the vaccination. As with any  medicine, there is a very remote chance of a vaccine causing a serious injury or death. The safety of vaccines is always being monitored. For more information, visit: http://floyd.org/www.cdc.gov/vaccinesafety/ 5. What if there is a serious reaction? What should I look for? Look for anything that concerns you, such as signs of a severe allergic reaction, very high fever, or unusual behavior. Signs of a severe allergic reaction can include hives, swelling of the face and throat, difficulty breathing, a fast heartbeat, dizziness, and weakness. These would usually start a few minutes to a few hours after the vaccination. What should I do?  If you think it is a severe allergic reaction or other emergency that can't wait, call 9-1-1 or get the person to the nearest hospital. Otherwise, call your doctor.  Afterward, the reaction should be reported to the Vaccine Adverse Event Reporting System (VAERS). Your doctor might file this report, or you can do it yourself through the VAERS web site at www.vaers.LAgents.nohhs.gov, or by calling 1-820 579 5586. ? VAERS does not give medical advice. 6. The National Vaccine Injury Compensation Program The [employer] Vaccine Injury Compensation Program (VICP) is a federal program that was created to compensate people who may have been injured by certain vaccines. Persons who believe they may have been injured by a vaccine can learn about the program and about filing a claim by calling 1-512-767-6653 or visiting the VICP website at SpiritualWord.atwww.hrsa.gov/vaccinecompensation. There is a time limit to file a claim for compensation. 7. How can I learn more?  Ask your doctor. He or she can give you the vaccine package insert or suggest other sources of information.  Call your local or state health department.  Contact the Centers for Disease Control and Prevention (CDC): ? Call (315) 772-07151-8723839473 (1-800-CDC-INFO) ? Visit CDC's website at PicCapture.uywww.cdc.gov/vaccines CDC Td Vaccine VIS (12/11/15) This information is  not intended to replace advice given to you by your health care provider. Make sure you discuss any questions you have with your health care provider. Document Released: 06/15/2006 Document Revised: 05/08/2016 Document Reviewed: 05/08/2016 Elsevier Interactive Patient Education  2017 [employer]  Follow up as needed

## 2018-03-08 NOTE — Progress Notes (Signed)
BP 137/82 (BP Location: Right Arm, Patient Position: Sitting, Cuff Size: Normal)   Pulse 99   Temp 98.3 F (36.8 C) (Oral)   Ht 5' (1.524 m)   Wt 208 lb 3.2 oz (94.4 kg)   SpO2 100%   BMI 40.66 kg/m    Subjective:    Patient ID: Beth Wagner, female    DOB: 1995-10-29, 22 y.o.   MRN: [id number]  HPI: Beth FickDondra Badertscher is a 22 y.o. female  Chief Complaint  Patient presents with  . Vaginal Discharge    Had vaginal discharge 2 days ago, but no longer does.   Pt here today for eval of a small lump on her right labia she noticed several days ago. Nontender, no discharge from the area. Seems to be getting smaller since she noticed it. Also having some vaginal discharge 2 days ago that has seemed to resolve as well. Denies urinary sxs, concern for STIs or pregnancy, fevers, rashes. Using dove sensitive skin soap and wearing cotton panties.   Relevant past medical, surgical, family and social history reviewed and updated as indicated. Interim medical history since our last visit reviewed. Allergies and medications reviewed and updated.  Review of Systems  Per HPI unless specifically indicated above     Objective:    BP 137/82 (BP Location: Right Arm, Patient Position: Sitting, Cuff Size: Normal)   Pulse 99   Temp 98.3 F (36.8 C) (Oral)   Ht 5' (1.524 m)   Wt 208 lb 3.2 oz (94.4 kg)   SpO2 100%   BMI 40.66 kg/m   Wt Readings from Last 3 Encounters:  03/08/18 208 lb 3.2 oz (94.4 kg)  12/17/17 202 lb (91.6 kg)  08/13/17 202 lb (91.6 kg)    Physical Exam  Constitutional: She is oriented to person, place, and time. She appears well-developed and well-nourished. No distress.  HENT:  Head: Atraumatic.  Eyes: Conjunctivae and EOM are normal.  Neck: Normal range of motion. Neck supple.  Cardiovascular: Normal rate, regular rhythm and normal heart sounds.  Pulmonary/Chest: Effort normal. No respiratory distress.  Abdominal: Soft. Bowel sounds are normal. She exhibits no  distension. There is no tenderness.  Genitourinary: No vaginal discharge found.  Genitourinary Comments: Small, firm cyst right labia. Non tender, non erythematous  Musculoskeletal: Normal range of motion. She exhibits no tenderness (No CVA tenderness b/l).  Neurological: She is alert and oriented to person, place, and time.  Skin: Skin is warm and dry. No rash noted. No erythema.  Psychiatric: She has a normal mood and affect. Her behavior is normal.  Nursing note and vitals reviewed.  Results for orders placed or performed in visit on 03/08/18  WET PREP FOR TRICH, YEAST, CLUE  Result Value Ref Range   Trichomonas Exam Negative Negative   Yeast Exam Negative Negative   Clue Cell Exam Negative Negative      Assessment & Plan:   Problem List Items Addressed This Visit    None    Visit Diagnoses    Vaginal discharge    -  Primary   Wet prep neg and sxs now resolved. Likely physiologic. Continue good hygiene practices and probiotics   Relevant Orders   WET PREP FOR TRICH, YEAST, CLUE (Completed)   Need for Td vaccine       Relevant Orders   Td vaccine greater than or equal to 7yo preservative free IM (Completed)   Labial cyst       Resolving, non-tender. No evidence of infection.  Warm compresses, continue to monitor. Return precautions reviewed       Follow up plan: Return if symptoms worsen or fail to improve.

## 2018-03-10 ENCOUNTER — Encounter: Payer: Self-pay | Admitting: Family Medicine

## 2018-03-27 ENCOUNTER — Encounter: Payer: Self-pay | Admitting: Family Medicine

## 2018-05-05 ENCOUNTER — Encounter: Payer: Self-pay | Admitting: Family Medicine

## 2018-05-05 ENCOUNTER — Ambulatory Visit (INDEPENDENT_AMBULATORY_CARE_PROVIDER_SITE_OTHER): Payer: Commercial Managed Care - PPO | Admitting: Family Medicine

## 2018-05-05 ENCOUNTER — Other Ambulatory Visit: Payer: Self-pay

## 2018-05-05 ENCOUNTER — Encounter: Payer: Commercial Managed Care - PPO | Admitting: Family Medicine

## 2018-05-05 VITALS — BP 137/94 | HR 94 | Temp 98.4°F | Ht 61.5 in | Wt 209.3 lb

## 2018-05-05 DIAGNOSIS — Z0000 Encounter for general adult medical examination without abnormal findings: Secondary | ICD-10-CM | POA: Diagnosis not present

## 2018-05-05 DIAGNOSIS — Z124 Encounter for screening for malignant neoplasm of cervix: Secondary | ICD-10-CM | POA: Diagnosis not present

## 2018-05-05 DIAGNOSIS — Z23 Encounter for immunization: Secondary | ICD-10-CM | POA: Diagnosis not present

## 2018-05-05 LAB — UA/M W/RFLX CULTURE, ROUTINE
BILIRUBIN UA: NEGATIVE
GLUCOSE, UA: NEGATIVE
Ketones, UA: NEGATIVE
Leukocytes, UA: NEGATIVE
Nitrite, UA: NEGATIVE
Protein, UA: NEGATIVE
RBC, UA: NEGATIVE
Specific Gravity, UA: 1.02 (ref 1.005–1.030)
Urobilinogen, Ur: 0.2 mg/dL (ref 0.2–1.0)
pH, UA: 5 (ref 5.0–7.5)

## 2018-05-05 NOTE — Progress Notes (Signed)
BP 139/84   Pulse 91   Temp 98.4 F (36.9 C) (Oral)   Ht 5' 1.5" (1.562 m)   Wt 209 lb 5 oz (94.9 kg)   SpO2 100%   BMI 38.91 kg/m    Subjective:    Patient ID: Beth Wagner, female    DOB: [birth year], 22 y.o.   MRN: [id number]  HPI: Beth Wagner is a 22 y.o. female presenting on 05/05/2018 for comprehensive medical examination. Current medical complaints include:none  Depression Screen done today and results listed below:  Depression screen [facility] 2/9 05/06/2017 05/01/2017 04/29/2016  Decreased Interest 0 0 0  Down, Depressed, Hopeless 0 0 0  PHQ - 2 Score 0 0 0  Altered sleeping - 0 -  Tired, decreased energy - 0 -  Change in appetite - 0 -  Feeling bad or failure about yourself  - 0 -  Trouble concentrating - 0 -  Moving slowly or fidgety/restless - 0 -  Suicidal thoughts - 0 -  PHQ-9 Score - 0 -    The patient does not have a history of falls. I did not complete a risk assessment for falls. A plan of care for falls was not documented.   Past Medical History:  Past Medical History:  Diagnosis Date  . Headache   . Ovarian cyst     Surgical History:  History reviewed. No pertinent surgical history.  Medications:  Current Outpatient Medications on File Prior to Visit  Medication Sig  . fluticasone (FLONASE) 50 MCG/ACT nasal spray Place into the nose.  . Multiple Vitamins-Minerals (HAIR SKIN AND NAILS FORMULA PO) Take by mouth.  . Probiotic Product (PROBIOTIC DAILY PO) Take 1 capsule by mouth every other day.   No current facility-administered medications on file prior to visit.     Allergies:  No Known Allergies  Social History:  Social History   Socioeconomic History  . Marital status: Single    Spouse name: Not on file  . Number of children: Not on file  . Years of education: Not on file  . Highest education level: Not on file  Occupational History  . Not on file  Social Needs  . Financial resource strain: Not on file  . Food insecurity:    Worry:  Not on file    Inability: Not on file  . Transportation needs:    Medical: Not on file    Non-medical: Not on file  Tobacco Use  . Smoking status: Never Smoker  . Smokeless tobacco: Never Used  Substance and Sexual Activity  . Alcohol use: No  . Drug use: No  . Sexual activity: Not Currently  Lifestyle  . Physical activity:    Days per week: Not on file    Minutes per session: Not on file  . Stress: Not on file  Relationships  . Social connections:    Talks on phone: Not on file    Gets together: Not on file    Attends religious service: Not on file    Active member of club or organization: Not on file    Attends meetings of clubs or organizations: Not on file    Relationship status: Not on file  . Intimate partner violence:    Fear of current or ex partner: Not on file    Emotionally abused: Not on file    Physically abused: Not on file    Forced sexual activity: Not on file  Other Topics Concern  . Not on file  Social History Narrative  . Not on file   Social History   Tobacco Use  Smoking Status Never Smoker  Smokeless Tobacco Never Used   Social History   Substance and Sexual Activity  Alcohol Use No    Family History:  Family History  Problem Relation Age of Onset  . Cancer Father        kidney  . Kidney disease Father   . Hypertension Father   . Hypertension Maternal Aunt   . Hypertension Paternal Aunt   . Stroke Maternal Grandmother   . Stroke Maternal Grandfather     Past medical history, surgical history, medications, allergies, family history and social history reviewed with patient today and changes made to appropriate areas of the chart.   Review of Systems - General ROS: negative Psychological ROS: negative Ophthalmic ROS: negative ENT ROS: negative Allergy and Immunology ROS: negative Hematological and Lymphatic ROS: negative Endocrine ROS: negative Breast ROS: negative for breast lumps Respiratory ROS: no cough, shortness of breath,  or wheezing Cardiovascular ROS: no chest pain or dyspnea on exertion Gastrointestinal ROS: no abdominal pain, change in bowel habits, or black or bloody stools Genito-Urinary ROS: no dysuria, trouble voiding, or hematuria Musculoskeletal ROS: negative Neurological ROS: no TIA or stroke symptoms Dermatological ROS: negative All other ROS negative except what is listed above and in the HPI.      Objective:    BP 139/84   Pulse 91   Temp 98.4 F (36.9 C) (Oral)   Ht 5' 1.5" (1.562 m)   Wt 209 lb 5 oz (94.9 kg)   SpO2 100%   BMI 38.91 kg/m   Wt Readings from Last 3 Encounters:  05/05/18 209 lb 5 oz (94.9 kg)  03/08/18 208 lb 3.2 oz (94.4 kg)  12/17/17 202 lb (91.6 kg)    Physical Exam  Constitutional: She is oriented to person, place, and time. She appears well-developed and well-nourished. No distress.  HENT:  Head: Atraumatic.  Right Ear: External ear normal.  Left Ear: External ear normal.  Nose: Nose normal.  Mouth/Throat: Oropharynx is clear and moist. No oropharyngeal exudate.  Eyes: Pupils are equal, round, and reactive to light. Conjunctivae are normal. No scleral icterus.  Neck: Normal range of motion. Neck supple. No thyromegaly present.  Cardiovascular: Normal rate, regular rhythm, normal heart sounds and intact distal pulses.  Pulmonary/Chest: Effort normal and breath sounds normal. No respiratory distress.  Abdominal: Soft. Bowel sounds are normal. She exhibits no mass. There is no tenderness.  Genitourinary:  Genitourinary Comments: GU visit declined  Musculoskeletal: Normal range of motion. She exhibits no edema or tenderness.  Lymphadenopathy:    She has no cervical adenopathy.  Neurological: She is alert and oriented to person, place, and time. No cranial nerve deficit.  Skin: Skin is warm and dry. No rash noted.  Psychiatric: She has a normal mood and affect. Her behavior is normal.  Nursing note and vitals reviewed.   Results for orders placed or  performed in visit on 03/08/18  WET PREP FOR TRICH, YEAST, CLUE  Result Value Ref Range   Trichomonas Exam Negative Negative   Yeast Exam Negative Negative   Clue Cell Exam Negative Negative      Assessment & Plan:   Problem List Items Addressed This Visit    None    Visit Diagnoses    Annual physical exam    -  Primary   Relevant Orders   CBC with Differential/Platelet   Comprehensive metabolic panel  Lipid Panel w/o Chol/HDL Ratio   TSH   UA/M w/rflx Culture, Routine   Flu vaccine need       Relevant Orders   Flu Vaccine QUAD 36+ mos IM (Completed)   Cervical cancer screening       Relevant Orders   Ambulatory referral to Gynecology       Follow up plan: Return in about 1 year (around 05/06/2019) for CPE.   LABORATORY TESTING:  - Pap smear: requesting referral to specific GYN for paps - referral generated today  IMMUNIZATIONS:   - Tdap: Tetanus vaccination status reviewed: last tetanus booster within 10 years. - Influenza: Administered today  PATIENT COUNSELING:   Advised to take 1 mg of folate supplement per day if capable of pregnancy.   Sexuality: Discussed sexually transmitted diseases, partner selection, use of condoms, avoidance of unintended pregnancy  and contraceptive alternatives.   Advised to avoid cigarette smoking.  I discussed with the patient that most people either abstain from alcohol or drink within safe limits (<=14/week and <=4 drinks/occasion for males, <=7/weeks and <= 3 drinks/occasion for females) and that the risk for alcohol disorders and other health effects rises proportionally with the number of drinks per week and how often a drinker exceeds daily limits.  Discussed cessation/primary prevention of drug use and availability of treatment for abuse.   Diet: Encouraged to adjust caloric intake to maintain  or achieve ideal body weight, to reduce intake of dietary saturated fat and total fat, to limit sodium intake by avoiding high sodium  foods and not adding table salt, and to maintain adequate dietary potassium and calcium preferably from fresh fruits, vegetables, and low-fat dairy products.    stressed the importance of regular exercise  Injury prevention: Discussed safety belts, safety helmets, smoke detector, smoking near bedding or upholstery.   Dental health: Discussed importance of regular tooth brushing, flossing, and dental visits.    NEXT PREVENTATIVE PHYSICAL DUE IN 1 YEAR. Return in about 1 year (around 05/06/2019) for CPE.

## 2018-05-05 NOTE — Patient Instructions (Signed)
Follow up in 1 year.

## 2018-05-06 LAB — CBC WITH DIFFERENTIAL/PLATELET
BASOS ABS: 0 10*3/uL (ref 0.0–0.2)
Basos: 0 %
EOS (ABSOLUTE): 0.2 10*3/uL (ref 0.0–0.4)
Eos: 3 %
HEMOGLOBIN: 13.2 g/dL (ref 11.1–15.9)
Hematocrit: 41.1 % (ref 34.0–46.6)
Immature Grans (Abs): 0 10*3/uL (ref 0.0–0.1)
Immature Granulocytes: 0 %
LYMPHS ABS: 1.7 10*3/uL (ref 0.7–3.1)
Lymphs: 27 %
MCH: 25.8 pg — AB (ref 26.6–33.0)
MCHC: 32.1 g/dL (ref 31.5–35.7)
MCV: 80 fL (ref 79–97)
MONOCYTES: 8 %
Monocytes Absolute: 0.5 10*3/uL (ref 0.1–0.9)
NEUTROS PCT: 62 %
Neutrophils Absolute: 3.7 10*3/uL (ref 1.4–7.0)
PLATELETS: 258 10*3/uL (ref 150–450)
RBC: 5.11 x10E6/uL (ref 3.77–5.28)
RDW: 13.7 % (ref 12.3–15.4)
WBC: 6.1 10*3/uL (ref 3.4–10.8)

## 2018-05-06 LAB — TSH: TSH: 1.8 u[IU]/mL (ref 0.450–4.500)

## 2018-05-06 LAB — LIPID PANEL W/O CHOL/HDL RATIO
CHOLESTEROL TOTAL: 132 mg/dL (ref 100–199)
HDL: 50 mg/dL (ref >39)
LDL Calculated: 72 mg/dL (ref 0–99)
TRIGLYCERIDES: 48 mg/dL (ref 0–149)
VLDL Cholesterol Cal: 10 mg/dL (ref 5–40)

## 2018-05-06 LAB — COMPREHENSIVE METABOLIC PANEL
ALBUMIN: 4.4 g/dL (ref 3.5–5.5)
ALK PHOS: 99 IU/L (ref 39–117)
ALT: 17 IU/L (ref 0–32)
AST: 18 IU/L (ref 0–40)
Albumin/Globulin Ratio: 1.6 (ref 1.2–2.2)
BILIRUBIN TOTAL: 0.5 mg/dL (ref 0.0–1.2)
BUN/Creatinine Ratio: 13 (ref 9–23)
BUN: 9 mg/dL (ref 6–20)
CHLORIDE: 101 mmol/L (ref 96–106)
CO2: 24 mmol/L (ref 20–29)
CREATININE: 0.7 mg/dL (ref 0.57–1.00)
Calcium: 9.5 mg/dL (ref 8.7–10.2)
GFR calc Af Amer: 143 mL/min/{1.73_m2} (ref >59)
GFR calc non Af Amer: 124 mL/min/{1.73_m2} (ref >59)
GLUCOSE: 79 mg/dL (ref 65–99)
Globulin, Total: 2.7 g/dL (ref 1.5–4.5)
Potassium: 4.5 mmol/L (ref 3.5–5.2)
Sodium: 140 mmol/L (ref 134–144)
Total Protein: 7.1 g/dL (ref 6.0–8.5)

## 2018-05-19 ENCOUNTER — Encounter: Payer: Self-pay | Admitting: Family Medicine

## 2018-06-22 ENCOUNTER — Ambulatory Visit: Payer: Commercial Managed Care - PPO | Admitting: Family Medicine

## 2018-07-24 ENCOUNTER — Encounter: Payer: Self-pay | Admitting: Family Medicine

## 2018-09-15 ENCOUNTER — Encounter: Payer: Self-pay | Admitting: Family Medicine

## 2018-09-28 ENCOUNTER — Encounter: Payer: Self-pay | Admitting: Family Medicine

## 2018-10-02 ENCOUNTER — Encounter: Payer: Self-pay | Admitting: Family Medicine

## 2018-10-07 ENCOUNTER — Ambulatory Visit: Payer: Commercial Managed Care - PPO | Admitting: Family Medicine

## 2018-10-07 ENCOUNTER — Other Ambulatory Visit: Payer: Self-pay

## 2018-10-07 ENCOUNTER — Encounter: Payer: Self-pay | Admitting: Family Medicine

## 2018-10-07 ENCOUNTER — Other Ambulatory Visit (HOSPITAL_COMMUNITY)
Admission: RE | Admit: 2018-10-07 | Discharge: 2018-10-07 | Disposition: A | Discharge status: Home / Self Care | Payer: Commercial Managed Care - PPO | Source: Ambulatory Visit | Attending: Family Medicine | Admitting: Family Medicine

## 2018-10-07 VITALS — HR 95 | Temp 99.4°F | Ht 62.0 in | Wt 220.0 lb

## 2018-10-07 DIAGNOSIS — Z124 Encounter for screening for malignant neoplasm of cervix: Secondary | ICD-10-CM | POA: Diagnosis present

## 2018-10-07 DIAGNOSIS — N898 Other specified noninflammatory disorders of vagina: Secondary | ICD-10-CM | POA: Diagnosis not present

## 2018-10-07 NOTE — Progress Notes (Signed)
Pulse 95   Temp 99.4 F (37.4 C) (Oral)   Ht 5\' 2"  (1.575 m)   Wt 220 lb (99.8 kg)   SpO2 98%   BMI 40.24 kg/m    Subjective:    Patient ID: Beth Wagner, female    DOB: Mar 16, 1996, 23 y.o.   MRN: [id number]  HPI: Beth Wagner is a 23 y.o. female  Chief Complaint  Patient presents with  . Papsmear  . Labs Only    pt states she would like to check for diabetes   Here today for an initial pap smear. No issues, was having some discharge that she was worried about but started back on probiotics and boric acid supplements and doing much better. Not sexually active and has never been, does not want any STI testing. No abnormal bleeding, cramping, abdominal pain, fevers, urinary sxs.   Relevant past medical, surgical, family and social history reviewed and updated as indicated. Interim medical history since our last visit reviewed. Allergies and medications reviewed and updated.  Review of Systems  Per HPI unless specifically indicated above     Objective:    Pulse 95   Temp 99.4 F (37.4 C) (Oral)   Ht 5\' 2"  (1.575 m)   Wt 220 lb (99.8 kg)   SpO2 98%   BMI 40.24 kg/m   Wt Readings from Last 3 Encounters:  10/07/18 220 lb (99.8 kg)  05/05/18 209 lb 5 oz (94.9 kg)  03/08/18 208 lb 3.2 oz (94.4 kg)    Physical Exam Vitals signs and nursing note reviewed.  Constitutional:      Appearance: Normal appearance. She is not ill-appearing.  HENT:     Head: Atraumatic.  Eyes:     Extraocular Movements: Extraocular movements intact.     Conjunctiva/sclera: Conjunctivae normal.  Neck:     Musculoskeletal: Normal range of motion and neck supple.  Cardiovascular:     Rate and Rhythm: Normal rate and regular rhythm.     Heart sounds: Normal heart sounds.  Pulmonary:     Effort: Pulmonary effort is normal.     Breath sounds: Normal breath sounds.  Genitourinary:    General: Normal vulva.     Vagina: No vaginal discharge.  Musculoskeletal: Normal range of motion.   Skin:    General: Skin is warm and dry.  Neurological:     Mental Status: She is alert and oriented to person, place, and time.  Psychiatric:        Mood and Affect: Mood normal.        Thought Content: Thought content normal.        Judgment: Judgment normal.     Results for orders placed or performed in visit on 05/05/18  CBC with Differential/Platelet  Result Value Ref Range   WBC 6.1 3.4 - 10.8 x10E3/uL   RBC 5.11 3.77 - 5.28 x10E6/uL   Hemoglobin 13.2 11.1 - 15.9 g/dL   Hematocrit 27.2 53.6 - 46.6 %   MCV 80 79 - 97 fL   MCH 25.8 (L) 26.6 - 33.0 pg   MCHC 32.1 31.5 - 35.7 g/dL   RDW 64.4 03.4 - 74.2 %   Platelets 258 150 - 450 x10E3/uL   Neutrophils 62 Not Estab. %   Lymphs 27 Not Estab. %   Monocytes 8 Not Estab. %   Eos 3 Not Estab. %   Basos 0 Not Estab. %   Neutrophils Absolute 3.7 1.4 - 7.0 x10E3/uL   Lymphocytes Absolute 1.7 0.7 -  3.1 x10E3/uL   Monocytes Absolute 0.5 0.1 - 0.9 x10E3/uL   EOS (ABSOLUTE) 0.2 0.0 - 0.4 x10E3/uL   Basophils Absolute 0.0 0.0 - 0.2 x10E3/uL   Immature Granulocytes 0 Not Estab. %   Immature Grans (Abs) 0.0 0.0 - 0.1 x10E3/uL  Comprehensive metabolic panel  Result Value Ref Range   Glucose 79 65 - 99 mg/dL   BUN 9 6 - 20 mg/dL   Creatinine, Ser 1.610.70 0.57 - 1.00 mg/dL   GFR calc non Af Amer 124 >59 mL/min/1.73   GFR calc Af Amer 143 >59 mL/min/1.73   BUN/Creatinine Ratio 13 9 - 23   Sodium 140 134 - 144 mmol/L   Potassium 4.5 3.5 - 5.2 mmol/L   Chloride 101 96 - 106 mmol/L   CO2 24 20 - 29 mmol/L   Calcium 9.5 8.7 - 10.2 mg/dL   Total Protein 7.1 6.0 - 8.5 g/dL   Albumin 4.4 3.5 - 5.5 g/dL   Globulin, Total 2.7 1.5 - 4.5 g/dL   Albumin/Globulin Ratio 1.6 1.2 - 2.2   Bilirubin Total 0.5 0.0 - 1.2 mg/dL   Alkaline Phosphatase 99 39 - 117 IU/L   AST 18 0 - 40 IU/L   ALT 17 0 - 32 IU/L  Lipid Panel w/o Chol/HDL Ratio  Result Value Ref Range   Cholesterol, Total 132 100 - 199 mg/dL   Triglycerides 48 0 - 149 mg/dL   HDL 50  >09>39 mg/dL   VLDL Cholesterol Cal 10 5 - 40 mg/dL   LDL Calculated 72 0 - 99 mg/dL  TSH  Result Value Ref Range   TSH 1.800 0.450 - 4.500 uIU/mL  UA/M w/rflx Culture, Routine  Result Value Ref Range   Specific Gravity, UA 1.020 1.005 - 1.030   pH, UA 5.0 5.0 - 7.5   Color, UA Yellow Yellow   Appearance Ur Clear Clear   Leukocytes, UA Negative Negative   Protein, UA Negative Negative/Trace   Glucose, UA Negative Negative   Ketones, UA Negative Negative   RBC, UA Negative Negative   Bilirubin, UA Negative Negative   Urobilinogen, Ur 0.2 0.2 - 1.0 mg/dL   Nitrite, UA Negative Negative      Assessment & Plan:   Problem List Items Addressed This Visit    None    Visit Diagnoses    Screening for cervical cancer    -  Primary   Pap done today   Relevant Orders   Cytology - PAP   Vaginal discharge       Resolved per patient, continue boric acid and probiotics. F/u if recurring for testing       Follow up plan: Return if symptoms worsen or fail to improve.

## 2018-10-08 LAB — CYTOLOGY - PAP: Diagnosis: NEGATIVE

## 2018-10-20 ENCOUNTER — Encounter: Payer: Self-pay | Admitting: Family Medicine

## 2018-11-17 ENCOUNTER — Encounter: Payer: Self-pay | Admitting: Family Medicine

## 2018-11-27 ENCOUNTER — Encounter: Payer: Self-pay | Admitting: Family Medicine

## 2018-12-03 ENCOUNTER — Encounter: Payer: Self-pay | Admitting: Family Medicine

## 2018-12-03 ENCOUNTER — Ambulatory Visit: Payer: Commercial Managed Care - PPO | Admitting: Family Medicine

## 2018-12-03 ENCOUNTER — Other Ambulatory Visit: Payer: Self-pay

## 2018-12-03 VITALS — BP 144/91 | HR 105 | Temp 98.1°F | Ht 60.0 in | Wt 221.0 lb

## 2018-12-03 DIAGNOSIS — Z021 Encounter for pre-employment examination: Secondary | ICD-10-CM

## 2018-12-03 LAB — UA/M W/RFLX CULTURE, ROUTINE
Bilirubin, UA: NEGATIVE
Glucose, UA: NEGATIVE
Ketones, UA: NEGATIVE
Leukocytes,UA: NEGATIVE
Nitrite, UA: NEGATIVE
Protein,UA: NEGATIVE
RBC, UA: NEGATIVE
Specific Gravity, UA: 1.02 (ref 1.005–1.030)
Urobilinogen, Ur: 0.2 mg/dL (ref 0.2–1.0)
pH, UA: 5 (ref 5.0–7.5)

## 2018-12-03 NOTE — Progress Notes (Signed)
   BP (!) 144/91   Pulse (!) 105   Temp 98.1 F (36.7 C) (Oral)   Ht 5' (1.524 m)   Wt 221 lb (100.2 kg)   SpO2 99%   BMI 43.16 kg/m    Subjective:    Patient ID: Beth Wagner, female    DOB: July 22, 1996, 23 y.o.   MRN: [id number]  HPI: Beth Wagner is a 23 y.o. female  Chief Complaint  Patient presents with  . 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 Doing well, no new concerns.   Relevant past medical, surgical, family and social history reviewed and updated as indicated. Interim medical history since our last visit reviewed. Allergies and medications reviewed and updated.  Review of Systems  Per HPI unless specifically indicated above     Objective:    BP (!) 144/91   Pulse (!) 105   Temp 98.1 F (36.7 C) (Oral)   Ht 5' (1.524 m)   Wt 221 lb (100.2 kg)   SpO2 99%   BMI 43.16 kg/m   Wt Readings from Last 3 Encounters:  12/03/18 221 lb (100.2 kg)  10/07/18 220 lb (99.8 kg)  05/05/18 209 lb 5 oz (94.9 kg)    Physical Exam Vitals signs and nursing note reviewed.  Constitutional:      Appearance: Normal appearance. She is not ill-appearing.  HENT:     Head: Atraumatic.  Eyes:     Extraocular Movements: Extraocular movements intact.     Conjunctiva/sclera: Conjunctivae normal.  Neck:     Musculoskeletal: Normal range of motion and neck supple.  Cardiovascular:     Rate and Rhythm: Normal rate and regular rhythm.     Heart sounds: Normal heart sounds.  Pulmonary:     Effort: Pulmonary effort is normal.     Breath sounds: Normal breath sounds.  Musculoskeletal: Normal range of motion.  Skin:    General: Skin is warm and dry.  Neurological:     Mental Status: She is alert and oriented to person, place, and time.  Psychiatric:        Mood and Affect: Mood normal.        Thought Content: Thought content normal.        Judgment: Judgment normal.     Results for orders placed or performed in  visit on 10/07/18  Cytology - PAP  Result Value Ref Range   Adequacy      Satisfactory for evaluation  endocervical/transformation zone component PRESENT.   Diagnosis      NEGATIVE FOR INTRAEPITHELIAL LESIONS OR MALIGNANCY.   Material Submitted CervicoVaginal Pap [ThinPrep Imaged]    CYTOLOGY - PAP PAP RESULT       Assessment & Plan:   Problem List Items Addressed This Visit    None    Visit Diagnoses    Pre-employment examination    -  Primary   Vision and hearing testing normal, will obtain requested labs and tb testing. Forms completed   Relevant Orders   UA/M w/rflx Culture, Routine   CBC with Differential/Platelet   QuantiFERON-TB Gold Plus       Follow up plan: Return for CPE.

## 2018-12-05 LAB — CBC WITH DIFFERENTIAL/PLATELET
Basophils Absolute: 0 10*3/uL (ref 0.0–0.2)
Basos: 1 %
EOS (ABSOLUTE): 0.1 10*3/uL (ref 0.0–0.4)
Eos: 2 %
Hematocrit: 40 % (ref 34.0–46.6)
Hemoglobin: 13.7 g/dL (ref 11.1–15.9)
Immature Grans (Abs): 0 10*3/uL (ref 0.0–0.1)
Immature Granulocytes: 0 %
Lymphocytes Absolute: 1.7 10*3/uL (ref 0.7–3.1)
Lymphs: 33 %
MCH: 27 pg (ref 26.6–33.0)
MCHC: 34.3 g/dL (ref 31.5–35.7)
MCV: 79 fL (ref 79–97)
Monocytes Absolute: 0.4 10*3/uL (ref 0.1–0.9)
Monocytes: 7 %
Neutrophils Absolute: 3.1 10*3/uL (ref 1.4–7.0)
Neutrophils: 57 %
Platelets: 268 10*3/uL (ref 150–450)
RBC: 5.07 x10E6/uL (ref 3.77–5.28)
RDW: 14.6 % (ref 11.7–15.4)
WBC: 5.3 10*3/uL (ref 3.4–10.8)

## 2018-12-05 LAB — QUANTIFERON-TB GOLD PLUS
QuantiFERON Mitogen Value: >10 IU/mL
QuantiFERON Nil Value: 0.01 IU/mL
QuantiFERON TB1 Ag Value: 0.01 IU/mL
QuantiFERON TB2 Ag Value: 0.01 IU/mL
QuantiFERON-TB Gold Plus: NEGATIVE

## 2018-12-09 ENCOUNTER — Encounter: Payer: Self-pay | Admitting: Family Medicine

## 2018-12-21 ENCOUNTER — Encounter: Payer: Self-pay | Admitting: Family Medicine

## 2019-01-17 ENCOUNTER — Encounter: Payer: Self-pay | Admitting: Family Medicine

## 2019-03-03 ENCOUNTER — Encounter: Payer: Self-pay | Admitting: Family Medicine

## 2019-03-22 ENCOUNTER — Encounter: Payer: Self-pay | Admitting: Family Medicine

## 2019-03-23 NOTE — Telephone Encounter (Signed)
Left message on machine for pt to return call to the office.  

## 2019-03-24 NOTE — Telephone Encounter (Signed)
Left message on machine for pt to return call to the office.  

## 2019-04-17 ENCOUNTER — Encounter: Payer: Self-pay | Admitting: Family Medicine

## 2019-05-10 ENCOUNTER — Other Ambulatory Visit: Payer: Self-pay

## 2019-05-10 ENCOUNTER — Ambulatory Visit (INDEPENDENT_AMBULATORY_CARE_PROVIDER_SITE_OTHER): Payer: Commercial Managed Care - PPO

## 2019-05-10 ENCOUNTER — Encounter: Payer: Commercial Managed Care - PPO | Admitting: Family Medicine

## 2019-05-10 DIAGNOSIS — Z23 Encounter for immunization: Secondary | ICD-10-CM

## 2019-05-25 ENCOUNTER — Encounter: Payer: Self-pay | Admitting: Family Medicine

## 2019-05-25 ENCOUNTER — Ambulatory Visit (INDEPENDENT_AMBULATORY_CARE_PROVIDER_SITE_OTHER): Payer: Commercial Managed Care - PPO | Admitting: Family Medicine

## 2019-05-25 ENCOUNTER — Other Ambulatory Visit: Payer: Self-pay

## 2019-05-25 VITALS — BP 137/89 | HR 104 | Temp 98.8°F | Ht 61.2 in | Wt 215.8 lb

## 2019-05-25 DIAGNOSIS — Z0000 Encounter for general adult medical examination without abnormal findings: Secondary | ICD-10-CM | POA: Diagnosis not present

## 2019-05-25 LAB — UA/M W/RFLX CULTURE, ROUTINE
Bilirubin, UA: NEGATIVE
Glucose, UA: NEGATIVE
Ketones, UA: NEGATIVE
Leukocytes,UA: NEGATIVE
Nitrite, UA: NEGATIVE
Protein,UA: NEGATIVE
RBC, UA: NEGATIVE
Specific Gravity, UA: 1.02 (ref 1.005–1.030)
Urobilinogen, Ur: 0.2 mg/dL (ref 0.2–1.0)
pH, UA: 5 (ref 5.0–7.5)

## 2019-05-25 NOTE — Progress Notes (Signed)
BP 137/89   Pulse (!) 104   Temp 98.8 F (37.1 C) (Oral)   Ht 5' 1.2" (1.554 m)   Wt 215 lb 12.8 oz (97.9 kg)   LMP 04/25/2019 (Exact Date)   SpO2 100%   BMI 40.51 kg/m    Subjective:    Patient ID: Beth Wagner, female    DOB: 1996/08/18, 23 y.o.   MRN: [id number]  HPI: Beth Wagner is a 23 y.o. female presenting on 05/25/2019 for comprehensive medical examination. Current medical complaints include:none  She currently lives with: Menopausal Symptoms: no  Depression Screen done today and results listed below:  Depression screen [facility] 2/9 05/25/2019 05/05/2018 05/06/2017 05/01/2017 04/29/2016  Decreased Interest 0 0 0 0 0  Down, Depressed, Hopeless 0 0 0 0 0  PHQ - 2 Score 0 0 0 0 0  Altered sleeping 0 0 - 0 -  Tired, decreased energy 0 0 - 0 -  Change in appetite 0 0 - 0 -  Feeling bad or failure about yourself  0 0 - 0 -  Trouble concentrating 0 0 - 0 -  Moving slowly or fidgety/restless 0 0 - 0 -  Suicidal thoughts 0 0 - 0 -  PHQ-9 Score 0 0 - 0 -  Difficult doing work/chores Not difficult at all - - - -    The patient does not have a history of falls. I did complete a risk assessment for falls. A plan of care for falls was documented.   Past Medical History:  Past Medical History:  Diagnosis Date  . Headache   . Ovarian cyst     Surgical History:  History reviewed. No pertinent surgical history.  Medications:  Current Outpatient Medications on File Prior to Visit  Medication Sig  . fluticasone (FLONASE) 50 MCG/ACT nasal spray Place into the nose.  . Multiple Vitamins-Minerals (HAIR SKIN AND NAILS FORMULA PO) Take by mouth.   No current facility-administered medications on file prior to visit.     Allergies:  No Known Allergies  Social History:  Social History   Socioeconomic History  . Marital status: Single    Spouse name: Not on file  . Number of children: Not on file  . Years of education: Not on file  . Highest education level: Not on file   Occupational History  . Not on file  Social Needs  . Financial resource strain: Not on file  . Food insecurity    Worry: Not on file    Inability: Not on file  . Transportation needs    Medical: Not on file    Non-medical: Not on file  Tobacco Use  . Smoking status: Never Smoker  . Smokeless tobacco: Never Used  Substance and Sexual Activity  . Alcohol use: No  . Drug use: No  . Sexual activity: Not Currently  Lifestyle  . Physical activity    Days per week: Not on file    Minutes per session: Not on file  . Stress: Not on file  Relationships  . Social Herbalist on phone: Not on file    Gets together: Not on file    Attends religious service: Not on file    Active member of club or organization: Not on file    Attends meetings of clubs or organizations: Not on file    Relationship status: Not on file  . Intimate partner violence    Fear of current or ex partner: Not on file  Emotionally abused: Not on file    Physically abused: Not on file    Forced sexual activity: Not on file  Other Topics Concern  . Not on file  Social History Narrative  . Not on file   Social History   Tobacco Use  Smoking Status Never Smoker  Smokeless Tobacco Never Used   Social History   Substance and Sexual Activity  Alcohol Use No    Family History:  Family History  Problem Relation Age of Onset  . Cancer Father        kidney  . Kidney disease Father   . Hypertension Father   . Hypertension Maternal Aunt   . Hypertension Paternal Aunt   . Stroke Maternal Grandmother   . Stroke Maternal Grandfather     Past medical history, surgical history, medications, allergies, family history and social history reviewed with patient today and changes made to appropriate areas of the chart.   Review of Systems - General ROS: negative Psychological ROS: negative Ophthalmic ROS: negative ENT ROS: negative Allergy and Immunology ROS: negative Hematological and Lymphatic  ROS: negative Endocrine ROS: negative Breast ROS: negative for breast lumps Respiratory ROS: no cough, shortness of breath, or wheezing Cardiovascular ROS: no chest pain or dyspnea on exertion Gastrointestinal ROS: no abdominal pain, change in bowel habits, or black or bloody stools Genito-Urinary ROS: no dysuria, trouble voiding, or hematuria Musculoskeletal ROS: negative Neurological ROS: no TIA or stroke symptoms Dermatological ROS: negative All other ROS negative except what is listed above and in the HPI.      Objective:    BP 137/89   Pulse (!) 104   Temp 98.8 F (37.1 C) (Oral)   Ht 5' 1.2" (1.554 m)   Wt 215 lb 12.8 oz (97.9 kg)   LMP 04/25/2019 (Exact Date)   SpO2 100%   BMI 40.51 kg/m   Wt Readings from Last 3 Encounters:  05/25/19 215 lb 12.8 oz (97.9 kg)  12/03/18 221 lb (100.2 kg)  10/07/18 220 lb (99.8 kg)    Physical Exam Vitals signs and nursing note reviewed.  Constitutional:      General: She is not in acute distress.    Appearance: She is well-developed.  HENT:     Head: Atraumatic.     Right Ear: External ear normal.     Left Ear: External ear normal.     Nose: Nose normal.     Mouth/Throat:     Pharynx: No oropharyngeal exudate.  Eyes:     General: No scleral icterus.    Conjunctiva/sclera: Conjunctivae normal.     Pupils: Pupils are equal, round, and reactive to light.  Neck:     Musculoskeletal: Normal range of motion and neck supple.     Thyroid: No thyromegaly.  Cardiovascular:     Rate and Rhythm: Normal rate and regular rhythm.     Heart sounds: Normal heart sounds.  Pulmonary:     Effort: Pulmonary effort is normal. No respiratory distress.     Breath sounds: Normal breath sounds.  Chest:     Breasts:        Right: No mass, skin change or tenderness.        Left: No mass, skin change or tenderness.  Abdominal:     General: Bowel sounds are normal.     Palpations: Abdomen is soft. There is no mass.     Tenderness: There is no  abdominal tenderness.  Musculoskeletal: Normal range of motion.  General: No tenderness.  Lymphadenopathy:     Cervical: No cervical adenopathy.     Upper Body:     Right upper body: No axillary adenopathy.     Left upper body: No axillary adenopathy.  Skin:    General: Skin is warm and dry.     Findings: No rash.  Neurological:     Mental Status: She is alert and oriented to person, place, and time.     Cranial Nerves: No cranial nerve deficit.  Psychiatric:        Behavior: Behavior normal.     Results for orders placed or performed in visit on 12/03/18  UA/M w/rflx Culture, Routine   Specimen: Urine   URINE  Result Value Ref Range   Specific Gravity, UA 1.020 1.005 - 1.030   pH, UA 5.0 5.0 - 7.5   Color, UA Yellow Yellow   Appearance Ur Clear Clear   Leukocytes,UA Negative Negative   Protein,UA Negative Negative/Trace   Glucose, UA Negative Negative   Ketones, UA Negative Negative   RBC, UA Negative Negative   Bilirubin, UA Negative Negative   Urobilinogen, Ur 0.2 0.2 - 1.0 mg/dL   Nitrite, UA Negative Negative  CBC with Differential/Platelet  Result Value Ref Range   WBC 5.3 3.4 - 10.8 x10E3/uL   RBC 5.07 3.77 - 5.28 x10E6/uL   Hemoglobin 13.7 11.1 - 15.9 g/dL   Hematocrit 40.940.0 81.134.0 - 46.6 %   MCV 79 79 - 97 fL   MCH 27.0 26.6 - 33.0 pg   MCHC 34.3 31.5 - 35.7 g/dL   RDW 91.414.6 78.211.7 - 95.615.4 %   Platelets 268 150 - 450 x10E3/uL   Neutrophils 57 Not Estab. %   Lymphs 33 Not Estab. %   Monocytes 7 Not Estab. %   Eos 2 Not Estab. %   Basos 1 Not Estab. %   Neutrophils Absolute 3.1 1.4 - 7.0 x10E3/uL   Lymphocytes Absolute 1.7 0.7 - 3.1 x10E3/uL   Monocytes Absolute 0.4 0.1 - 0.9 x10E3/uL   EOS (ABSOLUTE) 0.1 0.0 - 0.4 x10E3/uL   Basophils Absolute 0.0 0.0 - 0.2 x10E3/uL   Immature Granulocytes 0 Not Estab. %   Immature Grans (Abs) 0.0 0.0 - 0.1 x10E3/uL  QuantiFERON-TB Gold Plus  Result Value Ref Range   QuantiFERON Incubation Incubation performed.     QuantiFERON Criteria Comment    QuantiFERON TB1 Ag Value 0.01 IU/mL   QuantiFERON TB2 Ag Value 0.01 IU/mL   QuantiFERON Nil Value 0.01 IU/mL   QuantiFERON Mitogen Value >10.00 IU/mL   QuantiFERON-TB Gold Plus Negative Negative      Assessment & Plan:   Problem List Items Addressed This Visit    None    Visit Diagnoses    Annual physical exam    -  Primary   Relevant Orders   CBC with Differential/Platelet   Comprehensive metabolic panel   Lipid Panel w/o Chol/HDL Ratio   TSH   UA/M w/rflx Culture, Routine       Follow up plan: Return in about 1 year (around 05/24/2020) for CPE.   LABORATORY TESTING:  - Pap smear: up to date  IMMUNIZATIONS:   - Tdap: Tetanus vaccination status reviewed: last tetanus booster within 10 years. - Influenza: Up to date  PATIENT COUNSELING:   Advised to take 1 mg of folate supplement per day if capable of pregnancy.   Sexuality: Discussed sexually transmitted diseases, partner selection, use of condoms, avoidance of unintended pregnancy  and contraceptive  alternatives.   Advised to avoid cigarette smoking.  I discussed with the patient that most people either abstain from alcohol or drink within safe limits (<=14/week and <=4 drinks/occasion for males, <=7/weeks and <= 3 drinks/occasion for females) and that the risk for alcohol disorders and other health effects rises proportionally with the number of drinks per week and how often a drinker exceeds daily limits.  Discussed cessation/primary prevention of drug use and availability of treatment for abuse.   Diet: Encouraged to adjust caloric intake to maintain  or achieve ideal body weight, to reduce intake of dietary saturated fat and total fat, to limit sodium intake by avoiding high sodium foods and not adding table salt, and to maintain adequate dietary potassium and calcium preferably from fresh fruits, vegetables, and low-fat dairy products.    stressed the importance of regular exercise   Injury prevention: Discussed safety belts, safety helmets, smoke detector, smoking near bedding or upholstery.   Dental health: Discussed importance of regular tooth brushing, flossing, and dental visits.    NEXT PREVENTATIVE PHYSICAL DUE IN 1 YEAR. Return in about 1 year (around 05/24/2020) for CPE.

## 2019-05-26 LAB — CBC WITH DIFFERENTIAL/PLATELET
Basophils Absolute: 0 10*3/uL (ref 0.0–0.2)
Basos: 0 %
EOS (ABSOLUTE): 0.1 10*3/uL (ref 0.0–0.4)
Eos: 2 %
Hematocrit: 41.7 % (ref 34.0–46.6)
Hemoglobin: 13.5 g/dL (ref 11.1–15.9)
Immature Grans (Abs): 0 10*3/uL (ref 0.0–0.1)
Immature Granulocytes: 0 %
Lymphocytes Absolute: 1.7 10*3/uL (ref 0.7–3.1)
Lymphs: 33 %
MCH: 25.9 pg — ABNORMAL LOW (ref 26.6–33.0)
MCHC: 32.4 g/dL (ref 31.5–35.7)
MCV: 80 fL (ref 79–97)
Monocytes Absolute: 0.5 10*3/uL (ref 0.1–0.9)
Monocytes: 10 %
Neutrophils Absolute: 2.9 10*3/uL (ref 1.4–7.0)
Neutrophils: 55 %
Platelets: 269 10*3/uL (ref 150–450)
RBC: 5.21 x10E6/uL (ref 3.77–5.28)
RDW: 13.5 % (ref 11.7–15.4)
WBC: 5.3 10*3/uL (ref 3.4–10.8)

## 2019-05-26 LAB — COMPREHENSIVE METABOLIC PANEL
ALT: 19 IU/L (ref 0–32)
AST: 23 IU/L (ref 0–40)
Albumin/Globulin Ratio: 1.5 (ref 1.2–2.2)
Albumin: 4.4 g/dL (ref 3.9–5.0)
Alkaline Phosphatase: 103 IU/L (ref 39–117)
BUN/Creatinine Ratio: 17 (ref 9–23)
BUN: 14 mg/dL (ref 6–20)
Bilirubin Total: 0.7 mg/dL (ref 0.0–1.2)
CO2: 22 mmol/L (ref 20–29)
Calcium: 9.6 mg/dL (ref 8.7–10.2)
Chloride: 101 mmol/L (ref 96–106)
Creatinine, Ser: 0.84 mg/dL (ref 0.57–1.00)
GFR calc Af Amer: 114 mL/min/{1.73_m2} (ref >59)
GFR calc non Af Amer: 99 mL/min/{1.73_m2} (ref >59)
Globulin, Total: 2.9 g/dL (ref 1.5–4.5)
Glucose: 86 mg/dL (ref 65–99)
Potassium: 4 mmol/L (ref 3.5–5.2)
Sodium: 138 mmol/L (ref 134–144)
Total Protein: 7.3 g/dL (ref 6.0–8.5)

## 2019-05-26 LAB — LIPID PANEL W/O CHOL/HDL RATIO
Cholesterol, Total: 129 mg/dL (ref 100–199)
HDL: 45 mg/dL (ref >39)
LDL Chol Calc (NIH): 75 mg/dL (ref 0–99)
Triglycerides: 35 mg/dL (ref 0–149)
VLDL Cholesterol Cal: 9 mg/dL (ref 5–40)

## 2019-05-26 LAB — TSH: TSH: 1.35 u[IU]/mL (ref 0.450–4.500)

## 2019-06-23 ENCOUNTER — Telehealth: Payer: Self-pay | Admitting: Family Medicine

## 2019-06-23 NOTE — Telephone Encounter (Signed)
Is there another place where you can squeeze her in?  Thanks,   -Mickel Baas

## 2019-06-23 NOTE — Telephone Encounter (Signed)
If you can find a day that does not already have 3 or more CPEs/new patients (with no more than 1 being new patient) per half day and there is a 40 min slot, go ahead.  Can't really use 8 or 120 for new patients because check in and paperwork takes so long.

## 2019-06-23 NOTE — Telephone Encounter (Signed)
Apt 07/15/2019 at 10am  Thanks,   -Mickel Baas

## 2019-06-23 NOTE — Telephone Encounter (Signed)
Pt called back and stated she cannot take the appt of 07/19/19.  She also can only do Thus and Fri of the week.  Please call pt back with another time Dr. B can schedule her in on the days above.  Thanks, [employer]

## 2019-06-24 ENCOUNTER — Ambulatory Visit: Payer: Commercial Managed Care - PPO | Admitting: Family Medicine

## 2019-07-15 ENCOUNTER — Encounter: Payer: Self-pay | Admitting: Family Medicine

## 2019-07-15 ENCOUNTER — Ambulatory Visit (INDEPENDENT_AMBULATORY_CARE_PROVIDER_SITE_OTHER): Payer: Commercial Managed Care - PPO | Admitting: Family Medicine

## 2019-07-15 ENCOUNTER — Other Ambulatory Visit: Payer: Self-pay

## 2019-07-15 VITALS — BP 137/88 | HR 94 | Temp 97.1°F | Ht 60.0 in | Wt 217.0 lb

## 2019-07-15 DIAGNOSIS — K429 Umbilical hernia without obstruction or gangrene: Secondary | ICD-10-CM | POA: Diagnosis not present

## 2019-07-15 DIAGNOSIS — Z6841 Body Mass Index (BMI) 40.0 and over, adult: Secondary | ICD-10-CM | POA: Insufficient documentation

## 2019-07-15 DIAGNOSIS — Z0000 Encounter for general adult medical examination without abnormal findings: Secondary | ICD-10-CM | POA: Diagnosis not present

## 2019-07-15 DIAGNOSIS — J301 Allergic rhinitis due to pollen: Secondary | ICD-10-CM | POA: Diagnosis not present

## 2019-07-15 NOTE — Patient Instructions (Signed)
Preventive Care 21-22 Years Old, Female Preventive care refers to visits with your health care provider and lifestyle choices that can promote health and wellness. This includes:  A yearly physical exam. This may also be called an annual well check.  Regular dental visits and eye exams.  Immunizations.  Screening for certain conditions.  Healthy lifestyle choices, such as eating a healthy diet, getting regular exercise, not using drugs or products that contain nicotine and tobacco, and limiting alcohol use. What can I expect for my preventive care visit? Physical exam Your health care provider will check your:  Height and weight. This may be used to calculate body mass index (BMI), which tells if you are at a healthy weight.  Heart rate and blood pressure.  Skin for abnormal spots. Counseling Your health care provider may ask you questions about your:  Alcohol, tobacco, and drug use.  Emotional well-being.  Home and relationship well-being.  Sexual activity.  Eating habits.  Work and work environment.  Method of birth control.  Menstrual cycle.  Pregnancy history. What immunizations do I need?  Influenza (flu) vaccine  This is recommended every year. Tetanus, diphtheria, and pertussis (Tdap) vaccine  You may need a Td booster every 10 years. Varicella (chickenpox) vaccine  You may need this if you have not been vaccinated. Human papillomavirus (HPV) vaccine  If recommended by your health care provider, you may need three doses over 6 months. Measles, mumps, and rubella (MMR) vaccine  You may need at least one dose of MMR. You may also need a second dose. Meningococcal conjugate (MenACWY) vaccine  One dose is recommended if you are age 19-21 years and a first-year college student living in a residence hall, or if you have one of several medical conditions. You may also need additional booster doses. Pneumococcal conjugate (PCV13) vaccine  You may need  this if you have certain conditions and were not previously vaccinated. Pneumococcal polysaccharide (PPSV23) vaccine  You may need one or two doses if you smoke cigarettes or if you have certain conditions. Hepatitis A vaccine  You may need this if you have certain conditions or if you travel or work in places where you may be exposed to hepatitis A. Hepatitis B vaccine  You may need this if you have certain conditions or if you travel or work in places where you may be exposed to hepatitis B. Haemophilus influenzae type b (Hib) vaccine  You may need this if you have certain conditions. You may receive vaccines as individual doses or as more than one vaccine together in one shot (combination vaccines). Talk with your health care provider about the risks and benefits of combination vaccines. What tests do I need?  Blood tests  Lipid and cholesterol levels. These may be checked every 5 years starting at age 20.  Hepatitis C test.  Hepatitis B test. Screening  Diabetes screening. This is done by checking your blood sugar (glucose) after you have not eaten for a while (fasting).  Sexually transmitted disease (STD) testing.  BRCA-related cancer screening. This may be done if you have a family history of breast, ovarian, tubal, or peritoneal cancers.  Pelvic exam and Pap test. This may be done every 3 years starting at age 21. Starting at age 30, this may be done every 5 years if you have a Pap test in combination with an HPV test. Talk with your health care provider about your test results, treatment options, and if necessary, the need for more tests.   Follow these instructions at home: Eating and drinking   Eat a diet that includes fresh fruits and vegetables, whole grains, lean protein, and low-fat dairy.  Take vitamin and mineral supplements as recommended by your health care provider.  Do not drink alcohol if: ? Your health care provider tells you not to drink. ? You are  pregnant, may be pregnant, or are planning to become pregnant.  If you drink alcohol: ? Limit how much you have to 0-1 drink a day. ? Be aware of how much alcohol is in your drink. In the [place] one drink equals one 12 oz bottle of beer (355 mL), one 5 oz glass of wine (148 mL), or one 1 oz glass of hard liquor (44 mL). Lifestyle  Take daily care of your teeth and gums.  Stay active. Exercise for at least 30 minutes on 5 or more days each week.  Do not use any products that contain nicotine or tobacco, such as cigarettes, e-cigarettes, and chewing tobacco. If you need help quitting, ask your health care provider.  If you are sexually active, practice safe sex. Use a condom or other form of birth control (contraception) in order to prevent pregnancy and STIs (sexually transmitted infections). If you plan to become pregnant, see your health care provider for a preconception visit. What's next?  Visit your health care provider once a year for a well check visit.  Ask your health care provider how often you should have your eyes and teeth checked.  Stay up to date on all vaccines. This information is not intended to replace advice given to you by your health care provider. Make sure you discuss any questions you have with your health care provider. Document Released: 10/14/2001 Document Revised: 04/29/2018 Document Reviewed: 04/29/2018 Elsevier Patient Education  2020 [employer]  

## 2019-07-15 NOTE — Assessment & Plan Note (Signed)
Discussed importance of healthy weight management Discussed diet and exercise  

## 2019-07-15 NOTE — Progress Notes (Signed)
Patient: Beth Wagner, Female    DOB: August 10, 1996, 23 y.o.   MRN: [id number] Visit Date: 07/15/2019  Today's Provider: Lavon Paganini, MD   Chief Complaint  Patient presents with  . Establish Care   Subjective:     Annual physical exam   Beth Wagner is a 23 y.o. female who presents today to establish care. She feels well. Pt reports increased urination just before her menstrual cycle.   She reports exercising regularly. She reports she is sleeping well.  Was previously being seen at [facility]. Wanted something closer to home.  States that BP often increases at doctors office due to anxiety. Her BP was 122/84, P 74 prior to coming into the office today.  She is in nursing school -----------------------------------------------------------------   Review of Systems  Constitutional: Negative.   HENT: Negative.   Eyes: Negative.   Respiratory: Negative.   Cardiovascular: Negative.   Gastrointestinal: Negative.   Endocrine: Positive for polyuria. Negative for cold intolerance, heat intolerance, polydipsia and polyphagia.  Genitourinary: Negative.   Musculoskeletal: Negative.   Skin: Negative.   Allergic/Immunologic: Negative.   Neurological: Negative.   Hematological: Negative.   Psychiatric/Behavioral: Negative.     Social History      She  reports that she has never smoked. She has never used smokeless tobacco. She reports that she does not drink alcohol or use drugs.       Social History   Socioeconomic History  . Marital status: Single    Spouse name: Not on file  . Number of children: Not on file  . Years of education: Not on file  . Highest education level: Not on file  Occupational History  . Not on file  Social Needs  . Financial resource strain: Not on file  . Food insecurity    Worry: Not on file    Inability: Not on file  . Transportation needs    Medical: Not on file    Non-medical: Not on file  Tobacco Use  . Smoking  status: Never Smoker  . Smokeless tobacco: Never Used  Substance and Sexual Activity  . Alcohol use: No  . Drug use: No  . Sexual activity: Not Currently  Lifestyle  . Physical activity    Days per week: Not on file    Minutes per session: Not on file  . Stress: Not on file  Relationships  . Social Herbalist on phone: Not on file    Gets together: Not on file    Attends religious service: Not on file    Active member of club or organization: Not on file    Attends meetings of clubs or organizations: Not on file    Relationship status: Not on file  Other Topics Concern  . Not on file  Social History Narrative  . Not on file    Past Medical History:  Diagnosis Date  . Headache   . Ovarian cyst      Patient Active Problem List   Diagnosis Date Noted  . Umbilical hernia 16/96/7893  . Allergic rhinitis 03/29/2015  . BMI (body mass index), pediatric 95-99% for age, obese child structured weight management/multidisciplinary intervention category 07/06/2014  . Myopia, left 07/06/2014    Past Surgical History:  Procedure Laterality Date  . NO PAST SURGERIES      Family History        Family Status  Relation Name Status  . Mother  Alive  .  Father  Alive  . Mat Aunt  (Not Specified)  . Ethlyn Daniels  (Not Specified)  . MGM  Alive  . MGF  Alive  . PGM  Alive  . PGF  Deceased        Her family history includes Alzheimer's disease in her maternal grandfather; Cancer in her father; Diabetes in her maternal grandmother; Hypertension in her father, maternal aunt, mother, paternal aunt, and paternal grandmother; Kidney disease in her father; Lung cancer in her paternal grandfather; Stroke in her maternal grandfather; Transient ischemic attack in her maternal grandmother.      No Known Allergies   Current Outpatient Medications:  .  Garlic (GARLIQUE PO), Take by mouth., Disp: , Rfl:  .  Multiple Vitamin (MULTIVITAMIN) tablet, Take 1 tablet by mouth daily., Disp: ,  Rfl:  .  fluticasone (FLONASE) 50 MCG/ACT nasal spray, Place into the nose., Disp: , Rfl:  .  Multiple Vitamins-Minerals (HAIR SKIN AND NAILS FORMULA PO), Take by mouth., Disp: , Rfl:    Patient Care Team: Virginia Crews, MD as PCP - General (Family Medicine)    Objective:    Vitals: BP 137/88 (BP Location: Left Arm, Patient Position: Sitting, Cuff Size: Large)   Pulse 94   Temp (!) 97.1 F (36.2 C) (Temporal)   Ht 5' (1.524 m)   Wt 217 lb (98.4 kg)   BMI 42.38 kg/m    Vitals:   07/15/19 1023  BP: 137/88  Pulse: 94  Temp: (!) 97.1 F (36.2 C)  TempSrc: Temporal  Weight: 217 lb (98.4 kg)  Height: 5' (1.524 m)     Physical Exam Vitals signs reviewed.  Constitutional:      General: She is not in acute distress.    Appearance: Normal appearance. She is well-developed. She is not diaphoretic.  HENT:     Head: Normocephalic and atraumatic.     Right Ear: Tympanic membrane, ear canal and external ear normal.     Left Ear: Tympanic membrane, ear canal and external ear normal.  Eyes:     General: No scleral icterus.    Conjunctiva/sclera: Conjunctivae normal.     Pupils: Pupils are equal, round, and reactive to light.  Neck:     Musculoskeletal: Neck supple.     Thyroid: No thyromegaly.  Cardiovascular:     Rate and Rhythm: Normal rate and regular rhythm.     Pulses: Normal pulses.     Heart sounds: Normal heart sounds. No murmur.  Pulmonary:     Effort: Pulmonary effort is normal. No respiratory distress.     Breath sounds: Normal breath sounds. No wheezing or rales.  Abdominal:     General: There is no distension.     Palpations: Abdomen is soft.     Tenderness: There is no abdominal tenderness.     Hernia: A hernia (small umbilical hernia palpable) is present.  Musculoskeletal:        General: No deformity.     Right lower leg: No edema.     Left lower leg: No edema.  Lymphadenopathy:     Cervical: No cervical adenopathy.  Skin:    General: Skin is  warm and dry.     Capillary Refill: Capillary refill takes less than 2 seconds.     Findings: No rash.  Neurological:     Mental Status: She is alert and oriented to person, place, and time. Mental status is at baseline.  Psychiatric:        Mood  and Affect: Mood normal.        Behavior: Behavior normal.        Thought Content: Thought content normal.      Depression Screen PHQ 2/9 Scores 07/15/2019 05/25/2019 05/05/2018 05/06/2017  PHQ - 2 Score 0 0 0 0  PHQ- 9 Score 0 0 0 -       Assessment & Plan:     Routine Health Maintenance and Physical Exam  Exercise Activities and Dietary recommendations Goals   None     Immunization History  Administered Date(s) Administered  . DTaP 03/14/1997, 05/15/1997, 07/17/1997, 04/19/1998, 03/26/2001  . HPV Quadrivalent 01/10/2011, 03/26/2011, 07/23/2011, 08/31/2012  . Hepatitis A 02/17/2012, 06/21/2013  . Hepatitis B 17-Jun-1997, 03/14/1997, 07/17/1997  . HiB (PRP-OMP) 03/14/1997, 05/15/1997, 07/17/1997, 04/19/1998  . IPV 03/14/1997, 05/15/1997, 07/17/1997, 03/26/2001  . Influenza,inj,Quad PF,6+ Mos 04/29/2016, 05/05/2018, 05/10/2019  . Influenza-Unspecified 06/22/2017  . MMR 04/19/1998, 03/26/2001  . Meningococcal Polysaccharide 06/21/2013  . Td 03/08/2018  . Tdap 01/25/2008  . Varicella 01/11/1998, 01/25/2008    Health Maintenance  Topic Date Due  . PAP-Cervical Cytology Screening  10/07/2021  . PAP SMEAR-Modifier  10/07/2021  . TETANUS/TDAP  03/08/2028  . INFLUENZA VACCINE  Completed  . HIV Screening  Completed     Discussed health benefits of physical activity, and encouraged her to engage in regular exercise appropriate for her age and condition.    -------------------------------------------------------------------- Problem List Items Addressed This Visit      Respiratory   Allergic rhinitis    Ok to use OTC antihistamine prn        Other   Umbilical hernia    Patient was previously referred to Gen Surg, but  she did not go She has very small umbilical hernia with no obstruction Advised to continue to monitor for symptoms      Morbid obesity ([place])    Discussed importance of healthy weight management Discussed diet and exercise        Other Visit Diagnoses    Encounter for annual physical exam    -  Primary       Return in about 1 year (around 07/14/2020) for CPE.   The entirety of the information documented in the History of Present Illness, Review of Systems and Physical Exam were personally obtained by me. Portions of this information were initially documented by Ashley Royalty, CMA and reviewed by me for thoroughness and accuracy.    Mona Ayars, Dionne Bucy, MD MPH [facility]

## 2019-07-15 NOTE — Assessment & Plan Note (Signed)
Ok to use OTC antihistamine prn

## 2019-07-15 NOTE — Assessment & Plan Note (Signed)
Patient was previously referred to Gen Surg, but she did not go She has very small umbilical hernia with no obstruction Advised to continue to monitor for symptoms

## 2019-07-29 ENCOUNTER — Encounter: Payer: Self-pay | Admitting: Family Medicine

## 2019-08-01 ENCOUNTER — Encounter: Payer: Self-pay | Admitting: Family Medicine

## 2019-11-04 ENCOUNTER — Ambulatory Visit: Payer: Commercial Managed Care - PPO | Admitting: Family Medicine

## 2019-11-18 ENCOUNTER — Ambulatory Visit: Payer: Commercial Managed Care - PPO | Admitting: Family Medicine

## 2019-12-20 NOTE — Progress Notes (Deleted)
    Established patient visit    Patient: Beth Wagner   DOB: [birth year]   23 y.o. Female  MRN: [id number] Visit Date: 12/20/2019  Today's healthcare provider: Shirlee Latch, MD   No chief complaint on file.  Subjective    HPI ***  {Show patient history (optional):23778::" "}   Medications: Outpatient Medications Prior to Visit  Medication Sig  . Garlic (GARLIQUE PO) Take by mouth.  . Multiple Vitamin (MULTIVITAMIN) tablet Take 1 tablet by mouth daily.   No facility-administered medications prior to visit.    Review of Systems  {Show previous labs (optional):23779::" "}   Objective    There were no vitals taken for this visit. {Show previous vital signs (optional):23777::" "}  Physical Exam  ***  No results found for any visits on 12/23/19.   Assessment & Plan    ***  No follow-ups on file.      {provider attestation***:1}   Shirlee Latch, MD  [facility] [phone] (phone) [phone] (fax)  [facility]

## 2019-12-23 ENCOUNTER — Ambulatory Visit: Payer: Commercial Managed Care - PPO | Admitting: Family Medicine

## 2020-04-12 ENCOUNTER — Telehealth: Payer: Self-pay

## 2020-04-12 NOTE — Telephone Encounter (Signed)
Copied from CRM [id number]. Topic: Appointment Scheduling - Scheduling Inquiry for Clinic >> Apr 12, 2020  2:06 PM Leary Roca wrote: Reason for CRM: Pt has an appt in Nov for cpe she is wanting to possible be called if there are any cancellation in September . Please reach out pt tp resch if possible .

## 2020-04-13 NOTE — Telephone Encounter (Signed)
Left message for patient to call back to try and reschedule her appt.

## 2020-04-16 ENCOUNTER — Encounter: Payer: Self-pay | Admitting: Family Medicine

## 2020-05-01 ENCOUNTER — Telehealth: Payer: Self-pay

## 2020-05-01 NOTE — Telephone Encounter (Signed)
Please review. Would it be okay for her to establish care with one of you?  Thanks,   -Vernona Rieger

## 2020-05-01 NOTE — Telephone Encounter (Signed)
We can see if any of the APPs would be willing to take her as a patient.

## 2020-05-01 NOTE — Telephone Encounter (Signed)
Copied from CRM [id number]. Topic: General - Other >> May 01, 2020  9:56 AM Lyn Hollingshead D wrote: PT would like to see a new provider other then Dr B / please advise

## 2020-05-03 NOTE — Telephone Encounter (Signed)
im ok to take her

## 2020-05-03 NOTE — Telephone Encounter (Signed)
LMTCB-If patient calls back ok for PEC/nurse or Admin to schedule patient with Joycelyn Man to establish care

## 2020-05-25 ENCOUNTER — Ambulatory Visit (INDEPENDENT_AMBULATORY_CARE_PROVIDER_SITE_OTHER): Payer: Commercial Managed Care - PPO | Admitting: Physician Assistant

## 2020-05-25 ENCOUNTER — Encounter: Payer: Self-pay | Admitting: Physician Assistant

## 2020-05-25 ENCOUNTER — Other Ambulatory Visit: Payer: Self-pay

## 2020-05-25 VITALS — BP 130/85 | HR 87 | Temp 98.6°F | Resp 16 | Ht 61.0 in | Wt 222.6 lb

## 2020-05-25 DIAGNOSIS — N926 Irregular menstruation, unspecified: Secondary | ICD-10-CM | POA: Diagnosis not present

## 2020-05-25 DIAGNOSIS — Z6841 Body Mass Index (BMI) 40.0 and over, adult: Secondary | ICD-10-CM

## 2020-05-25 DIAGNOSIS — Z23 Encounter for immunization: Secondary | ICD-10-CM | POA: Diagnosis not present

## 2020-05-25 DIAGNOSIS — K649 Unspecified hemorrhoids: Secondary | ICD-10-CM

## 2020-05-25 DIAGNOSIS — Z020 Encounter for examination for admission to educational institution: Secondary | ICD-10-CM | POA: Diagnosis not present

## 2020-05-25 LAB — POCT URINALYSIS DIPSTICK
Bilirubin, UA: NEGATIVE
Blood, UA: NEGATIVE
Glucose, UA: NEGATIVE
Ketones, UA: NEGATIVE
Leukocytes, UA: NEGATIVE
Nitrite, UA: NEGATIVE
Protein, UA: NEGATIVE
Spec Grav, UA: 1.025 (ref 1.010–1.025)
Urobilinogen, UA: 0.2 E.U./dL
pH, UA: 6.5 (ref 5.0–8.0)

## 2020-05-25 NOTE — Progress Notes (Signed)
   Complete physical exam   Patient: Beth Wagner   DOB: 03/25/1996   24 y.o. Female  MRN: [id number] Visit Date: 05/25/2020  Today's healthcare provider: Jennifer M Burnette, PA-C   Chief Complaint  Patient presents with  . Annual Exam   Subjective    Beth Wagner is a 24 y.o. female who presents today for a school examination. 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  She reports consuming a general diet. Home exercise routine includes walks for 25 minutes 3 times a week. She generally feels well. She reports sleeping well. She does have additional problems to discuss today.  HPI  She thinks she may have hemorrhoid, reports that when she wipes for the past 2 years off and on she sees blood on the TP. She does have a history of constipation. Reports that she is regular now.  Past Medical History:  Diagnosis Date  . Headache   . Ovarian cyst    Past Surgical History:  Procedure Laterality Date  . NO PAST SURGERIES     Social History   Socioeconomic History  . Marital status: Single    Spouse name: Not on file  . Number of children: 0  . Years of education: Not on file  . Highest education level: Not on file  Occupational History  . Occupation: CNA    Comment: also in nursing school  Tobacco Use  . Smoking status: Never Smoker  . Smokeless tobacco: Never Used  Vaping Use  . Vaping Use: Never used  Substance and Sexual Activity  . Alcohol use: Yes    Comment: rare wine intake  . Drug use: No  . Sexual activity: Not Currently    Birth control/protection: None, Abstinence  Other Topics Concern  . Not on file  Social History Narrative  . Not on file   Social Determinants of Health   Financial Resource Strain:   . Difficulty of Paying Living Expenses: Not on file  Food Insecurity:   . Worried About Running Out of Food in the Last Year: Not on file  . Ran Out of Food in the Last Year: Not on file  Transportation Needs:   . Lack of Transportation (Medical): Not on  file  . Lack of Transportation (Non-Medical): Not on file  Physical Activity:   . Days of Exercise per Week: Not on file  . Minutes of Exercise per Session: Not on file  Stress:   . Feeling of Stress : Not on file  Social Connections:   . Frequency of Communication with Friends and Family: Not on file  . Frequency of Social Gatherings with Friends and Family: Not on file  . Attends Religious Services: Not on file  . Active Member of Clubs or Organizations: Not on file  . Attends Club or Organization Meetings: Not on file  . Marital Status: Not on file  Intimate Partner Violence:   . Fear of Current or Ex-Partner: Not on file  . Emotionally Abused: Not on file  . Physically Abused: Not on file  . Sexually Abused: Not on file   Family Status  Relation Name Status  . Mother  Alive  . Father  Alive  . Mat Aunt  (Not Specified)  . Pat Aunt  (Not Specified)  . MGM  Alive  . MGF  Alive  . PGM  Alive  . PGF  Deceased  . Other  (Not Specified)   Family History  Problem Relation Age of Onset  . Hypertension   Mother   . Cancer Father        kidney  . Kidney disease Father   . Hypertension Father   . Hypertension Maternal Aunt   . Hypertension Paternal Aunt   . Diabetes Maternal Grandmother   . Transient ischemic attack Maternal Grandmother   . Stroke Maternal Grandfather   . Alzheimer's disease Maternal Grandfather   . Hypertension Paternal Grandmother   . Lung cancer Paternal Grandfather   . Colon cancer Other    No Known Allergies  Patient Care Team: Bacigalupo, Angela M, MD as PCP - General (Family Medicine)   Medications: Outpatient Medications Prior to Visit  Medication Sig  . Garlic (GARLIQUE PO) Take by mouth.  . Multiple Vitamin (MULTIVITAMIN) tablet Take 1 tablet by mouth daily.   No facility-administered medications prior to visit.    Review of Systems  Constitutional: Negative.   HENT: Negative.   Eyes: Negative.   Respiratory: Negative.     Cardiovascular: Negative.   Gastrointestinal: Positive for blood in stool ("sometimes").  Endocrine: Negative.   Genitourinary: Negative.   Musculoskeletal: Negative.   Skin: Negative.        "moles"   Allergic/Immunologic: Negative.   Neurological: Negative.   Hematological: Negative.   Psychiatric/Behavioral: Negative.     Last CBC Lab Results  Component Value Date   WBC 5.6 05/25/2020   HGB 12.7 05/25/2020   HCT 39.8 05/25/2020   MCV 81 05/25/2020   MCH 25.8 (L) 05/25/2020   RDW 14.2 05/25/2020   PLT 256 05/25/2020   Last metabolic panel Lab Results  Component Value Date   GLUCOSE 81 05/25/2020   NA 139 05/25/2020   K 4.5 05/25/2020   CL 100 05/25/2020   CO2 27 05/25/2020   BUN 8 05/25/2020   CREATININE 0.88 05/25/2020   GFRNONAA 93 05/25/2020   GFRAA 107 05/25/2020   CALCIUM 9.2 05/25/2020   PROT 6.8 05/25/2020   ALBUMIN 4.2 05/25/2020   LABGLOB 2.6 05/25/2020   AGRATIO 1.6 05/25/2020   BILITOT 0.7 05/25/2020   ALKPHOS 119 05/25/2020   AST 21 05/25/2020   ALT 15 05/25/2020   ANIONGAP 8 07/03/2016      Objective    BP 130/85 (BP Location: Left Arm, Patient Position: Sitting, Cuff Size: Large)   Pulse 87   Temp 98.6 F (37 C) (Oral)   Resp 16   Ht 5' 1" (1.549 m)   Wt 222 lb 9.6 oz (101 kg)   SpO2 100%   BMI 42.06 kg/m  BP Readings from Last 3 Encounters:  05/25/20 130/85  07/15/19 137/88  05/25/19 137/89   Wt Readings from Last 3 Encounters:  05/25/20 222 lb 9.6 oz (101 kg)  07/15/19 217 lb (98.4 kg)  05/25/19 215 lb 12.8 oz (97.9 kg)      Physical Exam   Hearing Screening   125Hz 250Hz 500Hz 1000Hz 2000Hz 3000Hz 4000Hz 6000Hz 8000Hz  Right ear:   Pass Pass Pass  Pass    Left ear:   Pass Pass Pass  Pass      Visual Acuity Screening   Right eye Left eye Both eyes  Without correction:     With correction: 20/25 20/25 20/20     Last depression screening scores PHQ 2/9 Scores 05/25/2020 07/15/2019 05/25/2019  PHQ - 2 Score 0 0 0   PHQ- 9 Score - 0 0   Last fall risk screening Fall Risk  07/15/2019  Falls in the past year? 0  Number falls   in past yr: 0  Injury with Fall? 0  Follow up Falls evaluation completed   Last Audit-C alcohol use screening Alcohol Use Disorder Test (AUDIT) 05/25/2020  1. How often do you have a drink containing alcohol? 1  2. How many drinks containing alcohol do you have on a typical day when you are drinking? 0  3. How often do you have six or more drinks on one occasion? 0  AUDIT-C Score 1  Alcohol Brief Interventions/Follow-up AUDIT Score <7 follow-up not indicated   A score of 3 or more in women, and 4 or more in men indicates increased risk for alcohol abuse, EXCEPT if all of the points are from question 1   No results found for any visits on 05/25/20.  Assessment & Plan    Routine Health Maintenance and Physical Exam  Exercise Activities and Dietary recommendations Goals   None     Immunization History  Administered Date(s) Administered  . DTaP 03/14/1997, 05/15/1997, 07/17/1997, 04/19/1998, 03/26/2001  . HPV Quadrivalent 01/10/2011, 03/26/2011, 07/23/2011, 08/31/2012  . Hepatitis A 02/17/2012, 06/21/2013  . Hepatitis B 09/25/1996, 03/14/1997, 07/17/1997  . HiB (PRP-OMP) 03/14/1997, 05/15/1997, 07/17/1997, 04/19/1998  . IPV 03/14/1997, 05/15/1997, 07/17/1997, 03/26/2001  . Influenza,inj,Quad PF,6+ Mos 04/29/2016, 05/05/2018, 05/10/2019  . Influenza-Unspecified 06/22/2017  . MMR 04/19/1998, 03/26/2001  . Meningococcal Polysaccharide 06/21/2013  . Td 03/08/2018  . Tdap 01/25/2008  . Varicella 01/11/1998, 01/25/2008    Health Maintenance  Topic Date Due  . COVID-19 Vaccine (1) Never done  . INFLUENZA VACCINE  04/01/2020  . PAP-Cervical Cytology Screening  10/07/2021  . PAP SMEAR-Modifier  10/07/2021  . TETANUS/TDAP  03/08/2028  . Hepatitis C Screening  Completed  . HIV Screening  Completed    Discussed health benefits of physical activity, and encouraged her  to engage in regular exercise appropriate for her age and condition.  1. School health examination Normal exam. Forms completed. Vaccinations updated.  - CBC with Differential/Platelet - Comprehensive metabolic panel - POCT urinalysis dipstick - POCT UA - Microalbumin - QuantiFERON-TB Gold Plus  2. Class 3 severe obesity due to excess calories with serious comorbidity and body mass index (BMI) of 40.0 to 44.9 in adult (HCC) Counseled patient on healthy lifestyle modifications including dieting and exercise.   3. Hemorrhoids, unspecified hemorrhoid type Suspected hemorrhoids. Discussed increasing dietary fiber and water intake. Avoid straining. When active can use Preparation H and epsom salt soaks. Call if recurs and not resolving.   4. Abnormal menstrual cycle Patient having abnormal menstrual cycles. Has remote h/o right side ovarian cyst. Will get US as below to r/o cyst or fibroids. - US Pelvic Complete With Transvaginal; Future  5. Need for influenza vaccination Flu vaccine given today without complication. Patient sat upright for 15 minutes to check for adverse reaction before being released. - Flu Vaccine QUAD 36+ mos IM  6. Need for Tdap vaccination Tdap Vaccine given to patient without complications. Patient sat for 15 minutes after administration and was tolerated well without adverse effects. - Tdap vaccine greater than or equal to 7yo IM   No follow-ups on file.     I, Jennifer M Burnette, PA-C, have reviewed all documentation for this visit. The documentation on 05/29/20 for the exam, diagnosis, procedures, and orders are all accurate and complete.   Jennifer M Burnette, PA-C  [facility] [phone] (phone) [phone] (fax)  [facility] 

## 2020-05-25 NOTE — Patient Instructions (Signed)
Health Maintenance, Female Adopting a healthy lifestyle and getting preventive care are important in promoting health and wellness. Ask your health care provider about:  The right schedule for you to have regular tests and exams.  Things you can do on your own to prevent diseases and keep yourself healthy. What should I know about diet, weight, and exercise? Eat a healthy diet   Eat a diet that includes plenty of vegetables, fruits, low-fat dairy products, and lean protein.  Do not eat a lot of foods that are high in solid fats, added sugars, or sodium. Maintain a healthy weight Body mass index (BMI) is used to identify weight problems. It estimates body fat based on height and weight. Your health care provider can help determine your BMI and help you achieve or maintain a healthy weight. Get regular exercise Get regular exercise. This is one of the most important things you can do for your health. Most adults should:  Exercise for at least 150 minutes each week. The exercise should increase your heart rate and make you sweat (moderate-intensity exercise).  Do strengthening exercises at least twice a week. This is in addition to the moderate-intensity exercise.  Spend less time sitting. Even light physical activity can be beneficial. Watch cholesterol and blood lipids Have your blood tested for lipids and cholesterol at 23 years of age, then have this test every 5 years. Have your cholesterol levels checked more often if:  Your lipid or cholesterol levels are high.  You are older than 23 years of age.  You are at high risk for heart disease. What should I know about cancer screening? Depending on your health history and family history, you may need to have cancer screening at various ages. This may include screening for:  Breast cancer.  Cervical cancer.  Colorectal cancer.  Skin cancer.  Lung cancer. What should I know about heart disease, diabetes, and high blood  pressure? Blood pressure and heart disease  High blood pressure causes heart disease and increases the risk of stroke. This is more likely to develop in people who have high blood pressure readings, are of African descent, or are overweight.  Have your blood pressure checked: ? Every 3-5 years if you are 18-39 years of age. ? Every year if you are 40 years old or older. Diabetes Have regular diabetes screenings. This checks your fasting blood sugar level. Have the screening done:  Once every three years after age 40 if you are at a normal weight and have a low risk for diabetes.  More often and at a younger age if you are overweight or have a high risk for diabetes. What should I know about preventing infection? Hepatitis B If you have a higher risk for hepatitis B, you should be screened for this virus. Talk with your health care provider to find out if you are at risk for hepatitis B infection. Hepatitis C Testing is recommended for:  Everyone born from 1945 through 1965.  Anyone with known risk factors for hepatitis C. Sexually transmitted infections (STIs)  Get screened for STIs, including gonorrhea and chlamydia, if: ? You are sexually active and are younger than 24 years of age. ? You are older than 24 years of age and your health care provider tells you that you are at risk for this type of infection. ? Your sexual activity has changed since you were last screened, and you are at increased risk for chlamydia or gonorrhea. Ask your health care provider if   you are at risk.  Ask your health care provider about whether you are at high risk for HIV. Your health care provider may recommend a prescription medicine to help prevent HIV infection. If you choose to take medicine to prevent HIV, you should first get tested for HIV. You should then be tested every 3 months for as long as you are taking the medicine. Pregnancy  If you are about to stop having your period (premenopausal) and  you may become pregnant, seek counseling before you get pregnant.  Take 400 to 800 micrograms (mcg) of folic acid every day if you become pregnant.  Ask for birth control (contraception) if you want to prevent pregnancy. Osteoporosis and menopause Osteoporosis is a disease in which the bones lose minerals and strength with aging. This can result in bone fractures. If you are 65 years old or older, or if you are at risk for osteoporosis and fractures, ask your health care provider if you should:  Be screened for bone loss.  Take a calcium or vitamin D supplement to lower your risk of fractures.  Be given hormone replacement therapy (HRT) to treat symptoms of menopause. Follow these instructions at home: Lifestyle  Do not use any products that contain nicotine or tobacco, such as cigarettes, e-cigarettes, and chewing tobacco. If you need help quitting, ask your health care provider.  Do not use street drugs.  Do not share needles.  Ask your health care provider for help if you need support or information about quitting drugs. Alcohol use  Do not drink alcohol if: ? Your health care provider tells you not to drink. ? You are pregnant, may be pregnant, or are planning to become pregnant.  If you drink alcohol: ? Limit how much you use to 0-1 drink a day. ? Limit intake if you are breastfeeding.  Be aware of how much alcohol is in your drink. In the [place] one drink equals one 12 oz bottle of beer (355 mL), one 5 oz glass of wine (148 mL), or one 1 oz glass of hard liquor (44 mL). General instructions  Schedule regular health, dental, and eye exams.  Stay current with your vaccines.  Tell your health care provider if: ? You often feel depressed. ? You have ever been abused or do not feel safe at home. Summary  Adopting a healthy lifestyle and getting preventive care are important in promoting health and wellness.  Follow your health care provider's instructions about healthy  diet, exercising, and getting tested or screened for diseases.  Follow your health care provider's instructions on monitoring your cholesterol and blood pressure. This information is not intended to replace advice given to you by your health care provider. Make sure you discuss any questions you have with your health care provider. Document Revised: 08/11/2018 Document Reviewed: 08/11/2018 Elsevier Patient Education  2020 Elsevier Inc.  

## 2020-05-27 ENCOUNTER — Encounter: Payer: Self-pay | Admitting: Physician Assistant

## 2020-05-28 ENCOUNTER — Encounter: Payer: Self-pay | Admitting: Physician Assistant

## 2020-05-28 ENCOUNTER — Telehealth: Payer: Self-pay

## 2020-05-28 LAB — CBC WITH DIFFERENTIAL/PLATELET
Basophils Absolute: 0 10*3/uL (ref 0.0–0.2)
Basos: 0 %
EOS (ABSOLUTE): 0.7 10*3/uL — ABNORMAL HIGH (ref 0.0–0.4)
Eos: 13 %
Hematocrit: 39.8 % (ref 34.0–46.6)
Hemoglobin: 12.7 g/dL (ref 11.1–15.9)
Immature Grans (Abs): 0 10*3/uL (ref 0.0–0.1)
Immature Granulocytes: 0 %
Lymphocytes Absolute: 1.7 10*3/uL (ref 0.7–3.1)
Lymphs: 30 %
MCH: 25.8 pg — ABNORMAL LOW (ref 26.6–33.0)
MCHC: 31.9 g/dL (ref 31.5–35.7)
MCV: 81 fL (ref 79–97)
Monocytes Absolute: 0.5 10*3/uL (ref 0.1–0.9)
Monocytes: 8 %
Neutrophils Absolute: 2.7 10*3/uL (ref 1.4–7.0)
Neutrophils: 49 %
Platelets: 256 10*3/uL (ref 150–450)
RBC: 4.93 x10E6/uL (ref 3.77–5.28)
RDW: 14.2 % (ref 11.7–15.4)
WBC: 5.6 10*3/uL (ref 3.4–10.8)

## 2020-05-28 LAB — COMPREHENSIVE METABOLIC PANEL
ALT: 15 IU/L (ref 0–32)
AST: 21 IU/L (ref 0–40)
Albumin/Globulin Ratio: 1.6 (ref 1.2–2.2)
Albumin: 4.2 g/dL (ref 3.9–5.0)
Alkaline Phosphatase: 119 IU/L (ref 44–121)
BUN/Creatinine Ratio: 9 (ref 9–23)
BUN: 8 mg/dL (ref 6–20)
Bilirubin Total: 0.7 mg/dL (ref 0.0–1.2)
CO2: 27 mmol/L (ref 20–29)
Calcium: 9.2 mg/dL (ref 8.7–10.2)
Chloride: 100 mmol/L (ref 96–106)
Creatinine, Ser: 0.88 mg/dL (ref 0.57–1.00)
GFR calc Af Amer: 107 mL/min/{1.73_m2} (ref >59)
GFR calc non Af Amer: 93 mL/min/{1.73_m2} (ref >59)
Globulin, Total: 2.6 g/dL (ref 1.5–4.5)
Glucose: 81 mg/dL (ref 65–99)
Potassium: 4.5 mmol/L (ref 3.5–5.2)
Sodium: 139 mmol/L (ref 134–144)
Total Protein: 6.8 g/dL (ref 6.0–8.5)

## 2020-05-28 LAB — QUANTIFERON-TB GOLD PLUS
QuantiFERON Mitogen Value: >10 IU/mL
QuantiFERON Nil Value: 0.02 IU/mL
QuantiFERON TB1 Ag Value: 0.01 IU/mL
QuantiFERON TB2 Ag Value: 0.02 IU/mL
QuantiFERON-TB Gold Plus: NEGATIVE

## 2020-05-28 NOTE — Telephone Encounter (Signed)
-----   Message from Margaretann Loveless, [place] sent at 05/28/2020  1:14 PM EDT ----- Blood count is physiologically normal. Kidney and liver function are normal. Sodium, potassium, and calcium are normal. Sugar is normal. Quantiferon gold TB screen is negative.

## 2020-05-28 NOTE — Telephone Encounter (Signed)
Blood count is physiologically normal. Kidney and liver function are normal. Sodium, potassium, and calcium are normal. Sugar is normal. Quantiferon gold TB screen is negative.  Written by Margaretann Loveless, PA-C on 05/28/2020 1:14 PM EDT Seen by patient Beth Wagner on 05/28/2020 2:04 PM

## 2020-05-29 LAB — POCT UA - MICROALBUMIN: Microalbumin Ur, POC: NEGATIVE mg/L

## 2020-06-05 ENCOUNTER — Ambulatory Visit: Payer: Commercial Managed Care - PPO

## 2020-06-08 ENCOUNTER — Other Ambulatory Visit: Payer: Self-pay

## 2020-06-08 ENCOUNTER — Ambulatory Visit
Admission: RE | Admit: 2020-06-08 | Discharge: 2020-06-08 | Disposition: A | Discharge status: Home / Self Care | Payer: Commercial Managed Care - PPO | Source: Ambulatory Visit | Attending: Physician Assistant | Admitting: Physician Assistant

## 2020-06-08 ENCOUNTER — Telehealth: Payer: Self-pay

## 2020-06-08 ENCOUNTER — Other Ambulatory Visit: Payer: Self-pay | Admitting: Physician Assistant

## 2020-06-08 DIAGNOSIS — N926 Irregular menstruation, unspecified: Secondary | ICD-10-CM

## 2020-06-08 NOTE — Telephone Encounter (Signed)
Written by Margaretann Loveless, PA-C on 06/08/2020 4:50 PM EDT Seen by patient Beth Wagner on 06/08/2020 5:51 PM

## 2020-06-08 NOTE — Telephone Encounter (Signed)
-----   Message from Margaretann Loveless, PA-C sent at 06/08/2020  4:50 PM EDT ----- Vaginal [place] is completely normal.

## 2020-06-15 ENCOUNTER — Ambulatory Visit (LOCAL_COMMUNITY_HEALTH_CENTER): Payer: Self-pay

## 2020-06-15 ENCOUNTER — Other Ambulatory Visit: Payer: Self-pay

## 2020-06-15 DIAGNOSIS — Z111 Encounter for screening for respiratory tuberculosis: Secondary | ICD-10-CM

## 2020-06-18 ENCOUNTER — Ambulatory Visit (LOCAL_COMMUNITY_HEALTH_CENTER): Payer: Commercial Managed Care - PPO

## 2020-06-18 ENCOUNTER — Other Ambulatory Visit: Payer: Self-pay

## 2020-06-18 DIAGNOSIS — Z111 Encounter for screening for respiratory tuberculosis: Secondary | ICD-10-CM

## 2020-06-18 LAB — TB SKIN TEST
Induration: 0 mm
TB Skin Test: NEGATIVE

## 2020-07-13 ENCOUNTER — Encounter: Payer: Self-pay | Admitting: Physician Assistant

## 2020-07-20 ENCOUNTER — Encounter: Payer: Commercial Managed Care - PPO | Admitting: Family Medicine

## 2020-07-20 ENCOUNTER — Encounter: Payer: Commercial Managed Care - PPO | Admitting: Physician Assistant

## 2020-08-06 ENCOUNTER — Ambulatory Visit: Payer: Commercial Managed Care - PPO | Admitting: Physician Assistant

## 2020-08-10 ENCOUNTER — Ambulatory Visit: Payer: Commercial Managed Care - PPO | Admitting: Physician Assistant

## 2020-08-10 ENCOUNTER — Other Ambulatory Visit: Payer: Self-pay

## 2020-08-10 ENCOUNTER — Encounter: Payer: Self-pay | Admitting: Physician Assistant

## 2020-08-10 VITALS — BP 123/83 | HR 90 | Temp 98.9°F | Resp 16 | Wt 224.6 lb

## 2020-08-10 DIAGNOSIS — D508 Other iron deficiency anemias: Secondary | ICD-10-CM

## 2020-08-10 DIAGNOSIS — Z800 Family history of malignant neoplasm of digestive organs: Secondary | ICD-10-CM | POA: Diagnosis not present

## 2020-08-10 DIAGNOSIS — K602 Anal fissure, unspecified: Secondary | ICD-10-CM

## 2020-08-10 MED ORDER — HYDROCORTISONE ACETATE 25 MG RE SUPP: 25.0000 mg | Freq: Two times a day (BID) | RECTAL | 0 refills | Status: DC | PRN

## 2020-08-10 MED ORDER — HYDROCORTISONE (PERIANAL) 2.5 % EX CREA: 1.0000 "application " | TOPICAL_CREAM | Freq: Two times a day (BID) | CUTANEOUS | 0 refills | Status: DC

## 2020-08-10 NOTE — Patient Instructions (Signed)
Anal Fissure, Adult  An anal fissure is a small tear or crack in the tissue around the opening of the butt (anus). Bleeding from the tear or crack usually stops on its own within a few minutes. The bleeding may happen every time you poop (have a bowel movement) until the tear or crack heals. What are the causes? This condition is usually caused by passing a large or hard poop (stool). Other causes include:  Trouble pooping (constipation).  Passing watery poop (diarrhea).  Inflammatory bowel disease (Crohn's disease or ulcerative colitis).  Childbirth.  Infections.  Anal sex. What are the signs or symptoms? Symptoms of this condition include:  Bleeding from the butt.  Small amounts of blood on your poop. The blood coats the outside of the poop. It is not mixed with the poop.  Small amounts of blood on the toilet paper or in the toilet after you poop.  Pain when passing poop.  Itching or irritation around the opening of the butt. How is this diagnosed? This condition may be diagnosed based on a physical exam. Your doctor may:  Check your butt. A tear can often be seen by checking the area with care.  Check your butt using a short tube (anoscope). The light in the tube will show any problems in your butt. How is this treated? Treatment for this condition may include:  Treating problems that make it hard for you to pass poop. You may be told to: ? Eat more fiber. ? Drink more fluid. ? Take fiber supplements. ? Take medicines that make poop soft.  Taking sitz baths. This may help to heal the tear.  Using creams and ointments. If your condition gets worse, other treatments may be needed such as:  A shot near the tear or crack (botulinum injection).  Surgery to repair the tear or crack. Follow these instructions at home: Eating and drinking   Avoid bananas and dairy products. These foods can make it hard to poop.  Drink enough fluid to keep your pee (urine) pale  yellow.  Eat foods that have a lot of fiber in them, such as: ? Beans. ? Whole grains. ? Fresh fruits. ? Fresh vegetables. General instructions   Take over-the-counter and prescription medicines only as told by your doctor.  Use creams or ointments only as told by your doctor.  Keep the butt area as clean and dry as you can.  Take a warm water bath (sitz bath) as told by your doctor. Do not use soap.  Keep all follow-up visits as told by your doctor. This is important. Contact a doctor if:  You have more bleeding.  You have a fever.  You have watery poop that is mixed with blood.  You have pain.  Your problem gets worse, not better. Summary  An anal fissure is a small tear or crack in the skin around the opening of the butt (anus).  This condition is usually caused by passing a large or hard poop (stool).  Treatment includes treating the problems that make it hard for you to pass poop.  Follow your doctor's instructions about caring for your condition at home.  Keep all follow-up visits as told by your doctor. This is important. This information is not intended to replace advice given to you by your health care provider. Make sure you discuss any questions you have with your health care provider. Document Revised: 01/28/2018 Document Reviewed: 01/28/2018 Elsevier Patient Education  2020 Elsevier Inc.  

## 2020-08-10 NOTE — Progress Notes (Signed)
Established patient visit   Patient: Beth Wagner   DOB: 13-Apr-1996   24 y.o. Female  MRN: [id number] Visit Date: 08/10/2020  Today's healthcare provider: Margaretann Loveless, PA-C   Chief Complaint  Patient presents with  . Hemorrhoids   Subjective    HPI  Patient with c/o hemorrhoids becoming more frequently.  She wants to check her iron again and reports that she has been taking the iron pills. Has history of iron deficiency noted from last labs.   Patient reports hemorrhoids as having occasional bleeding, streaked on the stool and pain. Denies itching.  Patient Active Problem List   Diagnosis Date Noted  . Class 3 severe obesity due to excess calories with serious comorbidity and body mass index (BMI) of 40.0 to 44.9 in adult [facility]) 07/15/2019  . Umbilical hernia 09/05/2016  . Allergic rhinitis 03/29/2015  . Myopia, left 07/06/2014   Past Medical History:  Diagnosis Date  . Headache   . Ovarian cyst        Medications: Outpatient Medications Prior to Visit  Medication Sig  . Garlic (GARLIQUE PO) Take by mouth.  . Multiple Vitamin (MULTIVITAMIN) tablet Take 1 tablet by mouth daily.   No facility-administered medications prior to visit.    Review of Systems  Last CBC Lab Results  Component Value Date   WBC 5.5 08/10/2020   HGB 13.1 08/10/2020   HCT 39.5 08/10/2020   MCV 79 08/10/2020   MCH 26.3 (L) 08/10/2020   RDW 14.0 08/10/2020   PLT 261 08/10/2020   Last metabolic panel Lab Results  Component Value Date   GLUCOSE 81 05/25/2020   NA 139 05/25/2020   K 4.5 05/25/2020   CL 100 05/25/2020   CO2 27 05/25/2020   BUN 8 05/25/2020   CREATININE 0.88 05/25/2020   GFRNONAA 93 05/25/2020   GFRAA 107 05/25/2020   CALCIUM 9.2 05/25/2020   PROT 6.8 05/25/2020   ALBUMIN 4.2 05/25/2020   LABGLOB 2.6 05/25/2020   AGRATIO 1.6 05/25/2020   BILITOT 0.7 05/25/2020   ALKPHOS 119 05/25/2020   AST 21 05/25/2020   ALT 15 05/25/2020   ANIONGAP 8  07/03/2016      Objective    BP 123/83 (BP Location: Left Arm, Patient Position: Sitting, Cuff Size: Large)   Pulse 90   Temp 98.9 F (37.2 C) (Oral)   Resp 16   Wt 224 lb 9.6 oz (101.9 kg)   BMI 42.44 kg/m  BP Readings from Last 3 Encounters:  08/10/20 123/83  05/25/20 130/85  07/15/19 137/88   Wt Readings from Last 3 Encounters:  08/10/20 224 lb 9.6 oz (101.9 kg)  05/25/20 222 lb 9.6 oz (101 kg)  07/15/19 217 lb (98.4 kg)      Physical Exam Vitals reviewed.  Constitutional:      General: She is not in acute distress.    Appearance: Normal appearance. She is well-developed and well-nourished. She is obese. She is not ill-appearing.  HENT:     Head: Normocephalic and atraumatic.  Eyes:     Extraocular Movements: EOM normal.  Cardiovascular:     Rate and Rhythm: Normal rate and regular rhythm.     Pulses: Normal pulses.     Heart sounds: Normal heart sounds. No murmur heard.   Pulmonary:     Effort: Pulmonary effort is normal. No respiratory distress.     Breath sounds: Normal breath sounds.  Genitourinary:    Comments: Patient declined rectal exam Musculoskeletal:  Cervical back: Normal range of motion and neck supple.  Neurological:     Mental Status: She is alert.  Psychiatric:        Mood and Affect: Mood and affect and mood normal.        Behavior: Behavior normal.        Thought Content: Thought content normal.        Judgment: Judgment normal.      No results found for any visits on 08/10/20.  Assessment & Plan     1. Iron deficiency anemia secondary to inadequate dietary iron intake H/O this and on once daily supplement. Will check labs as below and f/u pending results. - CBC w/Diff/Platelet - Fe+TIBC+Fer  2. Anal fissure Suspect anal fissure as source due to description of symptoms and occurring with harder stools. Advised patient to increase dietary fiber, increase water intake, may use miralax for constipation. Anusol rectal cream  provided as below. Referral to general surgery placed as patient states symptoms have been worsening and becoming more frequent. Discussed importance of exam and if she didn't want me to check, it was imperative they be able to examine to determine source and course of treatment. She agrees. Consult appreciated.  - Ambulatory referral to General Surgery - hydrocortisone (ANUSOL-HC) 2.5 % rectal cream; Place 1 application rectally 2 (two) times daily.  Dispense: 30 g; Refill: 0  3. Family history of colon cancer Having rectal bleeding with family history of colon cancer. May require further evaluation. Consult appreciated.  - Ambulatory referral to General Surgery   No follow-ups on file.      Delmer Islam, PA-C, have reviewed all documentation for this visit. The documentation on 08/21/20 for the exam, diagnosis, procedures, and orders are all accurate and complete.   Reine Just  [facility] [phone] (phone) [phone] (fax)  [facility]

## 2020-08-11 LAB — CBC WITH DIFFERENTIAL/PLATELET
Basophils Absolute: 0 10*3/uL (ref 0.0–0.2)
Basos: 0 %
EOS (ABSOLUTE): 0.1 10*3/uL (ref 0.0–0.4)
Eos: 2 %
Hematocrit: 39.5 % (ref 34.0–46.6)
Hemoglobin: 13.1 g/dL (ref 11.1–15.9)
Immature Grans (Abs): 0 10*3/uL (ref 0.0–0.1)
Immature Granulocytes: 0 %
Lymphocytes Absolute: 1.5 10*3/uL (ref 0.7–3.1)
Lymphs: 27 %
MCH: 26.3 pg — ABNORMAL LOW (ref 26.6–33.0)
MCHC: 33.2 g/dL (ref 31.5–35.7)
MCV: 79 fL (ref 79–97)
Monocytes Absolute: 0.5 10*3/uL (ref 0.1–0.9)
Monocytes: 8 %
Neutrophils Absolute: 3.4 10*3/uL (ref 1.4–7.0)
Neutrophils: 63 %
Platelets: 261 10*3/uL (ref 150–450)
RBC: 4.98 x10E6/uL (ref 3.77–5.28)
RDW: 14 % (ref 11.7–15.4)
WBC: 5.5 10*3/uL (ref 3.4–10.8)

## 2020-08-11 LAB — IRON,TIBC AND FERRITIN PANEL
Ferritin: 139 ng/mL (ref 15–150)
Iron Saturation: 16 % (ref 15–55)
Iron: 54 ug/dL (ref 27–159)
Total Iron Binding Capacity: 328 ug/dL (ref 250–450)
UIBC: 274 ug/dL (ref 131–425)

## 2020-08-13 ENCOUNTER — Encounter: Payer: Self-pay | Admitting: Physician Assistant

## 2020-08-27 ENCOUNTER — Ambulatory Visit: Payer: Commercial Managed Care - PPO | Admitting: Surgery

## 2020-09-05 ENCOUNTER — Ambulatory Visit: Payer: Commercial Managed Care - PPO | Admitting: Surgery

## 2020-09-12 ENCOUNTER — Encounter: Payer: Self-pay | Admitting: *Deleted

## 2020-09-18 ENCOUNTER — Encounter: Payer: Self-pay | Admitting: Physician Assistant

## 2020-09-18 ENCOUNTER — Telehealth: Payer: Self-pay

## 2020-09-18 NOTE — Telephone Encounter (Signed)
Copied from CRM [id number]. Topic: General - Other >> Sep 18, 2020  2:19 PM Lyn Hollingshead D wrote: PT cancel her appt  for 09/19/20 she ask about copay will she be refund ? Please advise

## 2020-09-19 ENCOUNTER — Telehealth: Payer: Commercial Managed Care - PPO | Admitting: Physician Assistant

## 2020-12-13 ENCOUNTER — Telehealth: Payer: Self-pay

## 2020-12-13 NOTE — Telephone Encounter (Signed)
Copied from CRM [id number]. Topic: Appointment Scheduling - Scheduling Inquiry for Clinic >> Dec 13, 2020 10:35 AM Gaetana Michaelis A wrote: Reason for CRM: Patient would like to be seen for a Physical (anytime after 05/26/21)  Patient was a previously seen by Prov. Burnette  Patient would like to be seen by either Dr. Sullivan Lone or Dr. Sherrie Mustache  Please contact to further advise when possible

## 2020-12-14 NOTE — Telephone Encounter (Signed)
I'm not taking patients except for acute problems right now, but we expect to have 2-3 new providers here by September. Recommend she call back in June or July to schedule physical

## 2020-12-15 NOTE — Telephone Encounter (Signed)
We will see work ins but I am not taking new patients.

## 2020-12-17 NOTE — Telephone Encounter (Signed)
Both are seeing the acute patients only.  Once new providers are here they will be set up with them

## 2021-04-17 ENCOUNTER — Telehealth: Payer: Self-pay

## 2021-04-17 NOTE — Telephone Encounter (Signed)
Copied from CRM [id number]. Topic: Appointment Scheduling - Scheduling Inquiry for Clinic >> Apr 17, 2021  9:07 AM Leafy Ro wrote: Reason for CRM: Pt has cpe sch with dennis on 06-10-2021 and pt is on waitlist an would like to see if elisa or anyone could do a cpe before 06-01-2021

## 2021-04-17 NOTE — Telephone Encounter (Signed)
Advised pt that we do not have physical appointments for the week of 09/26.  I told her to call her insurance company to see if they will pay for a physical early.  (Pt's last physical was 05/25/2020)  Thanks,   -Vernona Rieger

## 2021-06-10 ENCOUNTER — Encounter: Payer: Commercial Managed Care - PPO | Admitting: Family Medicine

## 2021-11-26 ENCOUNTER — Other Ambulatory Visit: Payer: Self-pay

## 2021-11-26 ENCOUNTER — Ambulatory Visit (LOCAL_COMMUNITY_HEALTH_CENTER): Payer: Commercial Managed Care - PPO

## 2021-11-26 DIAGNOSIS — Z111 Encounter for screening for respiratory tuberculosis: Secondary | ICD-10-CM

## 2021-11-29 ENCOUNTER — Ambulatory Visit (LOCAL_COMMUNITY_HEALTH_CENTER): Payer: Commercial Managed Care - PPO

## 2021-11-29 ENCOUNTER — Other Ambulatory Visit: Payer: Commercial Managed Care - PPO

## 2021-11-29 DIAGNOSIS — Z111 Encounter for screening for respiratory tuberculosis: Secondary | ICD-10-CM

## 2021-11-29 LAB — TB SKIN TEST
Induration: 0 mm
TB Skin Test: NEGATIVE

## 2022-04-17 ENCOUNTER — Other Ambulatory Visit: Payer: Commercial Managed Care - PPO

## 2022-05-02 ENCOUNTER — Other Ambulatory Visit: Payer: Commercial Managed Care - PPO

## 2022-05-13 ENCOUNTER — Other Ambulatory Visit (LOCAL_COMMUNITY_HEALTH_CENTER): Payer: Commercial Managed Care - PPO

## 2022-05-13 DIAGNOSIS — Z111 Encounter for screening for respiratory tuberculosis: Secondary | ICD-10-CM

## 2022-05-19 LAB — QUANTIFERON-TB GOLD PLUS
QuantiFERON Mitogen Value: >10 IU/mL
QuantiFERON Nil Value: 0.02 IU/mL
QuantiFERON TB1 Ag Value: 0.02 IU/mL
QuantiFERON TB2 Ag Value: 0.02 IU/mL
QuantiFERON-TB Gold Plus: NEGATIVE

## 2022-05-20 ENCOUNTER — Telehealth: Payer: Self-pay | Admitting: Surgery

## 2022-05-20 NOTE — Telephone Encounter (Signed)
Called patient to inform that QFT results are negative, left voicemail. If patient needs results calls [phone].   Leigh Aurora, MD

## 2022-06-02 ENCOUNTER — Encounter: Payer: Commercial Managed Care - PPO | Admitting: Family Medicine

## 2022-06-02 NOTE — Progress Notes (Unsigned)
Complete physical exam   Patient: Beth Wagner   DOB: Jun 29, 1996   26 y.o. Female  MRN: [id number] Visit Date: 06/04/2022  Today's healthcare provider: Gwyneth Sprout, FNP  Patient presents for new patient visit to establish care.  Introduced to Designer, jewellery role and practice setting.  All questions answered.  Discussed provider/patient relationship and expectations.   I,Ardian [place]land J Mieshia Pepitone,acting as a scribe for Gwyneth Sprout, FNP.,have documented all relevant documentation on the behalf of Gwyneth Sprout, FNP,as directed by  Gwyneth Sprout, FNP while in the presence of Gwyneth Sprout, FNP.   Chief Complaint  Patient presents with   Annual Exam   Subjective    Beth Wagner is a 26 y.o. female who presents today for a complete physical exam.  She reports consuming a general diet. The patient does not participate in regular exercise at present. She generally feels well. She reports sleeping poorly. She does have additional problems to discuss today. Wishes to discuss with provider.  HPI    Past Medical History:  Diagnosis Date   Headache    Ovarian cyst    Past Surgical History:  Procedure Laterality Date   NO PAST SURGERIES     Social History   Socioeconomic History   Marital status: Single    Spouse name: Not on file   Number of children: 0   Years of education: Not on file   Highest education level: Not on file  Occupational History   Occupation: CNA    Comment: also in nursing school  Tobacco Use   Smoking status: Never   Smokeless tobacco: Never  Vaping Use   Vaping Use: Never used  Substance and Sexual Activity   Alcohol use: Yes    Comment: rare wine intake   Drug use: No   Sexual activity: Not Currently    Birth control/protection: None, Abstinence  Other Topics Concern   Not on file  Social History Narrative   Not on file   Social Determinants of Health   Financial Resource Strain: Not on file  Food Insecurity: Not on file  Transportation  Needs: Not on file  Physical Activity: Not on file  Stress: Not on file  Social Connections: Not on file  Intimate Partner Violence: Not on file   Family Status  Relation Name Status   Mother  Alive   Father  Alive   Mat Aunt  (Not Specified)   Ethlyn Daniels  (Not Specified)   MGM  Alive   MGF  Alive   PGM  Alive   PGF  Deceased   Other  (Not Specified)   Family History  Problem Relation Age of Onset   Hypertension Mother    Cancer Father        kidney   Kidney disease Father    Hypertension Father    Hypertension Maternal Aunt    Hypertension Paternal Aunt    Diabetes Maternal Grandmother    Transient ischemic attack Maternal Grandmother    Stroke Maternal Grandfather    Alzheimer's disease Maternal Grandfather    Hypertension Paternal Grandmother    Lung cancer Paternal Grandfather    Colon cancer Other    No Known Allergies  Patient Care Team: Gwyneth Sprout, FNP as PCP - General (Family Medicine)   Medications: Outpatient Medications Prior to Visit  Medication Sig   Multiple Vitamin (MULTIVITAMIN) tablet Take 1 tablet by mouth daily.   [DISCONTINUED] Ferrous Sulfate (IRON SUPPLEMENT PO)    [  DISCONTINUED] Garlic (GARLIQUE PO) Take by mouth.   [DISCONTINUED] hydrocortisone (ANUSOL-HC) 2.5 % rectal cream Place 1 application rectally 2 (two) times daily.   [DISCONTINUED] hydrocortisone (ANUSOL-HC) 25 MG suppository Place 1 suppository (25 mg total) rectally 2 (two) times daily as needed for hemorrhoids or anal itching.   No facility-administered medications prior to visit.    Review of Systems   Objective    BP 127/89 (BP Location: Left Arm, Patient Position: Sitting, Cuff Size: Large)   Pulse (!) 106   Resp 16   Ht '5\' 1"'  (1.549 m)   Wt 246 lb (111.6 kg)   SpO2 100%   BMI 46.48 kg/m    Physical Exam Vitals and nursing note reviewed.  Constitutional:      General: She is awake. She is not in acute distress.    Appearance: Normal appearance. She is  well-developed and well-groomed. She is obese. She is not ill-appearing, toxic-appearing or diaphoretic.  HENT:     Head: Normocephalic and atraumatic.     Jaw: There is normal jaw occlusion. No trismus, tenderness, swelling or pain on movement.     Right Ear: Hearing, tympanic membrane, ear canal and external ear normal. There is no impacted cerumen.     Left Ear: Hearing, tympanic membrane, ear canal and external ear normal. There is no impacted cerumen.     Nose: Nose normal. No congestion or rhinorrhea.     Right Turbinates: Not enlarged, swollen or pale.     Left Turbinates: Not enlarged, swollen or pale.     Right Sinus: No maxillary sinus tenderness or frontal sinus tenderness.     Left Sinus: No maxillary sinus tenderness or frontal sinus tenderness.     Mouth/Throat:     Lips: Pink.     Mouth: Mucous membranes are moist. No injury.     Tongue: No lesions.     Pharynx: Oropharynx is clear. Uvula midline. No pharyngeal swelling, oropharyngeal exudate, posterior oropharyngeal erythema or uvula swelling.     Tonsils: No tonsillar exudate or tonsillar abscesses.  Eyes:     General: Lids are normal. Lids are everted, no foreign bodies appreciated. Vision grossly intact. Gaze aligned appropriately. No allergic shiner or visual field deficit.       Right eye: No discharge.        Left eye: No discharge.     Extraocular Movements: Extraocular movements intact.     Conjunctiva/sclera: Conjunctivae normal.     Right eye: Right conjunctiva is not injected. No exudate.    Left eye: Left conjunctiva is not injected. No exudate.    Pupils: Pupils are equal, round, and reactive to light.  Neck:     Thyroid: No thyroid mass, thyromegaly or thyroid tenderness.     Vascular: No carotid bruit.     Trachea: Trachea normal.  Cardiovascular:     Rate and Rhythm: Regular rhythm. Tachycardia present.     Pulses: Normal pulses.          Carotid pulses are 2+ on the right side and 2+ on the left  side.      Radial pulses are 2+ on the right side and 2+ on the left side.       Dorsalis pedis pulses are 2+ on the right side and 2+ on the left side.       Posterior tibial pulses are 2+ on the right side and 2+ on the left side.     Heart sounds: Normal heart sounds,  S1 normal and S2 normal. No murmur heard.    No friction rub. No gallop.  Pulmonary:     Effort: Pulmonary effort is normal. No respiratory distress.     Breath sounds: Normal breath sounds and air entry. No stridor. No wheezing, rhonchi or rales.  Chest:     Chest wall: No tenderness.  Abdominal:     General: Abdomen is flat. Bowel sounds are normal. There is no distension.     Palpations: Abdomen is soft. There is no mass.     Tenderness: There is no abdominal tenderness. There is no right CVA tenderness, left CVA tenderness, guarding or rebound.     Hernia: No hernia is present.  Genitourinary:    Comments: Exam deferred; endorses dysuria. UA negative. Reports 1 sexual partner and no concern for STIs Musculoskeletal:        General: No swelling, tenderness, deformity or signs of injury. Normal range of motion.     Cervical back: Full passive range of motion without pain, normal range of motion and neck supple. No edema, rigidity or tenderness. No muscular tenderness.     Right lower leg: No edema.     Left lower leg: No edema.  Lymphadenopathy:     Cervical: No cervical adenopathy.     Right cervical: No superficial, deep or posterior cervical adenopathy.    Left cervical: No superficial, deep or posterior cervical adenopathy.  Skin:    General: Skin is warm and dry.     Capillary Refill: Capillary refill takes less than 2 seconds.     Coloration: Skin is not jaundiced or pale.     Findings: No bruising, erythema, lesion or rash.  Neurological:     General: No focal deficit present.     Mental Status: She is alert and oriented to person, place, and time. Mental status is at baseline.     GCS: GCS eye subscore is  4. GCS verbal subscore is 5. GCS motor subscore is 6.     Sensory: Sensation is intact. No sensory deficit.     Motor: Motor function is intact. No weakness.     Coordination: Coordination is intact. Coordination normal.     Gait: Gait is intact. Gait normal.  Psychiatric:        Attention and Perception: Attention and perception normal.        Mood and Affect: Mood and affect normal.        Speech: Speech normal.        Behavior: Behavior normal. Behavior is cooperative.        Thought Content: Thought content normal.        Cognition and Memory: Cognition and memory normal.        Judgment: Judgment normal.     Last depression screening scores    06/04/2022    3:07 PM 05/25/2020    1:14 PM 07/15/2019   10:16 AM  PHQ 2/9 Scores  PHQ - 2 Score 0 0 0  PHQ- 9 Score 0  0   Last fall risk screening    06/04/2022    3:07 PM  [place] in the past year? 0  Number falls in past yr: 0  Injury with Fall? 0  Risk for fall due to : No Fall Risks  Follow up Falls evaluation completed   Last Audit-C alcohol use screening    06/04/2022    3:07 PM  Alcohol Use Disorder Test (AUDIT)  1. How often  do you have a drink containing alcohol? 1  2. How many drinks containing alcohol do you have on a typical day when you are drinking? 0  3. How often do you have six or more drinks on one occasion? 0  AUDIT-C Score 1   A score of 3 or more in women, and 4 or more in men indicates increased risk for alcohol abuse, EXCEPT if all of the points are from question 1   Results for orders placed or performed in visit on 06/04/22  POCT urinalysis dipstick  Result Value Ref Range   Color, UA Light Yellow    Clarity, UA Clear    Glucose, UA Negative Negative   Bilirubin, UA Negative    Ketones, UA Negative    Spec Grav, UA 1.025 1.010 - 1.025   Blood, UA Negative    pH, UA 6.0 5.0 - 8.0   Protein, UA Negative Negative   Urobilinogen, UA 0.2 0.2 or 1.0 E.U./dL   Nitrite, UA Negative     Leukocytes, UA Negative Negative   Appearance     Odor Positive     Assessment & Plan    Routine Health Maintenance and Physical Exam  Exercise Activities and Dietary recommendations  Goals   None     Immunization History  Administered Date(s) Administered   DTaP 03/14/1997, 05/15/1997, 07/17/1997, 04/19/1998, 03/26/2001   HIB (PRP-OMP) 03/14/1997, 05/15/1997, 07/17/1997, 04/19/1998   HPV Quadrivalent 01/10/2011, 03/26/2011, 07/23/2011, 08/31/2012   Hepatitis A 02/17/2012, 06/21/2013   Hepatitis B 01/04/1997, 03/14/1997, 07/17/1997   IPV 03/14/1997, 05/15/1997, 07/17/1997, 03/26/2001   Influenza,inj,Quad PF,6+ Mos 04/29/2016, 05/05/2018, 05/10/2019, 05/25/2020, 06/04/2022   Influenza-Unspecified 06/22/2017, 06/05/2021   MMR 04/19/1998, 03/26/2001   Meningococcal Polysaccharide 06/21/2013   PPD Test 06/15/2020, 11/26/2021   Td 03/08/2018   Tdap 01/25/2008, 05/25/2020   Varicella 01/11/1998, 01/25/2008    Health Maintenance  Topic Date Due   COVID-19 Vaccine (1) Never done   PAP-Cervical Cytology Screening  10/07/2021   PAP SMEAR-Modifier  10/07/2021   TETANUS/TDAP  05/25/2030   INFLUENZA VACCINE  Completed   HPV VACCINES  Completed   Hepatitis C Screening  Completed   HIV Screening  Completed    Discussed health benefits of physical activity, and encouraged her to engage in regular exercise appropriate for her age and condition.  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  - Eat a low-fat diet with lots of fruits and vegetables, up to 7-9 servings per day.  - Seatbelts can save your life. Wear them always.  - Smoke detectors on every level of your home, check batteries every year.  - Eye Doctor - have an eye exam every 1-2 years  - Safe sex - if you may be  exposed to STDs, use a condom.  - Alcohol -  If you drink, do it moderately, less than 2 drinks per day.  - [place]. Choose someone to speak for you if you are not able.  - Depression is common in our stressful world.If you're feeling down or losing interest in things you normally enjoy, please come in for a visit.  - Violence - If anyone is threatening or hurting you, please call immediately.  Due  for PAP- has upcoming appt just for PAP      Relevant Orders   CBC with Differential/Platelet   Comprehensive Metabolic Panel (CMET)   TSH + free T4   Lipid panel   Hemoglobin A1c   Dysuria    Reports symptoms for 1 week UA negative      Relevant Orders   POCT urinalysis dipstick (Completed)   Family history of diabetes mellitus    Check A1c given family hx and obesity       Relevant Orders   Hemoglobin A1c   Family history of hyperlipidemia    Check LP given family hx and obesity  recommend diet low in saturated fat and regular exercise - 30 min at least 5 times per week       Relevant Orders   Lipid panel   Morbidly obese (HCC)    Chronic, increased weight from last OV of 20# Body mass index is 46.48 kg/m. Discussed importance of healthy weight management Discussed diet and exercise       Relevant Orders   Comprehensive Metabolic Panel (CMET)   TSH + free T4   Lipid panel   Hemoglobin A1c   Need for influenza vaccination    Consented; VIS made available; no immediate side effects following administration; plan to repeat annually        Relevant Orders   Flu Vaccine QUAD 6+ mos PF IM (Fluarix Quad PF) (Completed)     Return if symptoms worsen or fail to improve.     Vonna Kotyk, FNP, have reviewed all documentation for this visit. The documentation on 06/04/22 for the exam, diagnosis, procedures, and orders are all accurate and complete.    Gwyneth Sprout, [place] [phone] (phone) [phone]  (fax)  [place]

## 2022-06-04 ENCOUNTER — Encounter: Payer: Self-pay | Admitting: Family Medicine

## 2022-06-04 ENCOUNTER — Ambulatory Visit (INDEPENDENT_AMBULATORY_CARE_PROVIDER_SITE_OTHER): Payer: Commercial Managed Care - PPO | Admitting: Family Medicine

## 2022-06-04 VITALS — BP 127/89 | HR 106 | Resp 16 | Ht 61.0 in | Wt 246.0 lb

## 2022-06-04 DIAGNOSIS — Z833 Family history of diabetes mellitus: Secondary | ICD-10-CM

## 2022-06-04 DIAGNOSIS — Z83438 Family history of other disorder of lipoprotein metabolism and other lipidemia: Secondary | ICD-10-CM | POA: Diagnosis not present

## 2022-06-04 DIAGNOSIS — R300 Dysuria: Secondary | ICD-10-CM | POA: Diagnosis not present

## 2022-06-04 DIAGNOSIS — Z0000 Encounter for general adult medical examination without abnormal findings: Secondary | ICD-10-CM | POA: Insufficient documentation

## 2022-06-04 DIAGNOSIS — Z23 Encounter for immunization: Secondary | ICD-10-CM | POA: Diagnosis not present

## 2022-06-04 LAB — POCT URINALYSIS DIPSTICK
Bilirubin, UA: NEGATIVE
Blood, UA: NEGATIVE
Glucose, UA: NEGATIVE
Ketones, UA: NEGATIVE
Leukocytes, UA: NEGATIVE
Nitrite, UA: NEGATIVE
Odor: POSITIVE
Protein, UA: NEGATIVE
Spec Grav, UA: 1.025 (ref 1.010–1.025)
Urobilinogen, UA: 0.2 E.U./dL
pH, UA: 6 (ref 5.0–8.0)

## 2022-06-04 NOTE — Assessment & Plan Note (Signed)
Check LP given family hx and obesity  recommend diet low in saturated fat and regular exercise - 30 min at least 5 times per week

## 2022-06-04 NOTE — Assessment & Plan Note (Signed)
Check A1c given family hx and obesity

## 2022-06-04 NOTE — Assessment & Plan Note (Signed)
Chronic, increased weight from last OV of 20# Body mass index is 46.48 kg/m. Discussed importance of healthy weight management Discussed diet and exercise

## 2022-06-04 NOTE — Assessment & Plan Note (Signed)
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  - Eat a low-fat diet with lots of fruits and vegetables, up to 7-9 servings per day.  - Seatbelts can save your life. Wear them always.  - Smoke detectors on every level of your home, check batteries every year.  - Eye Doctor - have an eye exam every 1-2 years  - Safe sex - if you may be exposed to STDs, use a condom.  - Alcohol -  If you drink, do it moderately, less than 2 drinks per day.  - [place]. Choose someone to speak for you if you are not able.  - Depression is common in our stressful world.If you're feeling down or losing interest in things you normally enjoy, please come in for a visit.  - Violence - If anyone is threatening or hurting you, please call immediately.  Due for PAP- has upcoming appt just for PAP

## 2022-06-04 NOTE — Assessment & Plan Note (Signed)
Reports symptoms for 1 week UA negative

## 2022-06-04 NOTE — Assessment & Plan Note (Signed)
Consented; VIS made available; no immediate side effects following administration; plan to repeat annually   

## 2022-06-20 ENCOUNTER — Ambulatory Visit: Payer: Commercial Managed Care - PPO | Admitting: Family Medicine

## 2022-06-20 NOTE — Progress Notes (Deleted)
      Established patient visit   Patient: Beth Wagner   DOB: 25-Apr-1996   26 y.o. Female  MRN: [id number] Visit Date: 06/26/2022  Today's healthcare provider: Gwyneth Sprout, FNP   No chief complaint on file.  Subjective    HPI  ***  Medications: Outpatient Medications Prior to Visit  Medication Sig   Multiple Vitamin (MULTIVITAMIN) tablet Take 1 tablet by mouth daily.   No facility-administered medications prior to visit.    Review of Systems  {Labs  Heme  Chem  Endocrine  Serology  Results Review (optional):23779}   Objective    There were no vitals taken for this visit. {Show previous vital signs (optional):23777}  Physical Exam  ***  No results found for any visits on 06/26/22.  Assessment & Plan     ***  No follow-ups on file.      {provider attestation***:1}   Gwyneth Sprout, [place] [phone] (phone) [phone] (fax)  [place]

## 2022-06-20 NOTE — Progress Notes (Deleted)
      Established patient visit   Patient: Beth Wagner   DOB: 06/18/1996   26 y.o. Female  MRN: [id number] Visit Date: 06/20/2022  Today's healthcare provider: Gwyneth Sprout, FNP   I,[place] J Mykael Trott,acting as a scribe for Gwyneth Sprout, FNP.,have documented all relevant documentation on the behalf of Gwyneth Sprout, FNP,as directed by  Gwyneth Sprout, FNP while in the presence of Gwyneth Sprout, FNP.   No chief complaint on file.  Subjective    HPI  ***  Medications: Outpatient Medications Prior to Visit  Medication Sig   Multiple Vitamin (MULTIVITAMIN) tablet Take 1 tablet by mouth daily.   No facility-administered medications prior to visit.    Review of Systems  {Labs  Heme  Chem  Endocrine  Serology  Results Review (optional):23779}   Objective    There were no vitals taken for this visit. {Show previous vital signs (optional):23777}  Physical Exam  ***  No results found for any visits on 06/20/22.  Assessment & Plan     ***  No follow-ups on file.      {provider attestation***:1}   Gwyneth Sprout, [place] [phone] (phone) [phone] (fax)  [place]

## 2022-06-26 ENCOUNTER — Ambulatory Visit: Payer: Commercial Managed Care - PPO | Admitting: Family Medicine

## 2022-10-24 ENCOUNTER — Ambulatory Visit: Payer: Commercial Managed Care - PPO

## 2023-08-18 ENCOUNTER — Ambulatory Visit: Payer: Commercial Managed Care - PPO | Admitting: Family Medicine

## 2023-09-15 ENCOUNTER — Ambulatory Visit: Payer: Commercial Managed Care - PPO | Admitting: Physician Assistant

## 2024-05-16 ENCOUNTER — Ambulatory Visit: Admitting: Family Medicine
